# Patient Record
Sex: Female | Born: 1985
Health system: Southern US, Community
[De-identification: ages and names within clinical notes are randomized; demographics above are authoritative.]

## PROBLEM LIST (undated history)

## (undated) ENCOUNTER — Ambulatory Visit

## (undated) ENCOUNTER — Inpatient Hospital Stay (HOSPITAL_COMMUNITY): Payer: Self-pay

---

## 2010-01-30 ENCOUNTER — Emergency Department: Payer: Self-pay | Admitting: Emergency Medicine

## 2010-04-30 ENCOUNTER — Emergency Department: Payer: Self-pay | Admitting: Internal Medicine

## 2010-05-28 ENCOUNTER — Emergency Department: Payer: Self-pay | Admitting: Unknown Physician Specialty

## 2010-07-01 ENCOUNTER — Emergency Department: Payer: Self-pay | Admitting: Emergency Medicine

## 2010-11-25 ENCOUNTER — Emergency Department: Payer: Self-pay | Admitting: Emergency Medicine

## 2010-12-08 ENCOUNTER — Emergency Department: Payer: Self-pay | Admitting: Unknown Physician Specialty

## 2011-02-01 ENCOUNTER — Emergency Department: Payer: Self-pay | Admitting: *Deleted

## 2011-03-18 ENCOUNTER — Emergency Department: Payer: Self-pay | Admitting: Emergency Medicine

## 2011-04-06 ENCOUNTER — Emergency Department: Payer: Self-pay | Admitting: Emergency Medicine

## 2011-04-12 ENCOUNTER — Emergency Department: Payer: Self-pay | Admitting: Emergency Medicine

## 2011-08-29 ENCOUNTER — Emergency Department: Payer: Self-pay | Admitting: Emergency Medicine

## 2012-04-13 ENCOUNTER — Emergency Department: Payer: Self-pay | Admitting: Emergency Medicine

## 2013-11-22 ENCOUNTER — Other Ambulatory Visit (HOSPITAL_COMMUNITY)
Admission: RE | Admit: 2013-11-22 | Discharge: 2013-11-22 | Disposition: A | Discharge status: Home / Self Care | Payer: BC Managed Care – PPO | Source: Ambulatory Visit | Attending: Obstetrics & Gynecology | Admitting: Obstetrics & Gynecology

## 2013-11-22 ENCOUNTER — Other Ambulatory Visit: Payer: Self-pay | Admitting: Obstetrics & Gynecology

## 2013-11-22 DIAGNOSIS — Z113 Encounter for screening for infections with a predominantly sexual mode of transmission: Secondary | ICD-10-CM | POA: Insufficient documentation

## 2013-11-22 DIAGNOSIS — Z01419 Encounter for gynecological examination (general) (routine) without abnormal findings: Secondary | ICD-10-CM | POA: Insufficient documentation

## 2013-11-27 LAB — CYTOLOGY - PAP

## 2014-04-16 ENCOUNTER — Ambulatory Visit: Payer: Self-pay | Admitting: Gastroenterology

## 2014-09-09 LAB — SURGICAL PATHOLOGY

## 2017-12-28 ENCOUNTER — Encounter: Payer: Self-pay | Admitting: Emergency Medicine

## 2017-12-28 ENCOUNTER — Other Ambulatory Visit: Payer: Self-pay

## 2017-12-28 ENCOUNTER — Emergency Department
Admission: EM | Admit: 2017-12-28 | Discharge: 2017-12-28 | Disposition: A | Discharge status: Home / Self Care | Payer: No Typology Code available for payment source | Attending: Emergency Medicine | Admitting: Emergency Medicine

## 2017-12-28 ENCOUNTER — Emergency Department: Payer: No Typology Code available for payment source

## 2017-12-28 DIAGNOSIS — Y939 Activity, unspecified: Secondary | ICD-10-CM | POA: Diagnosis not present

## 2017-12-28 DIAGNOSIS — M25561 Pain in right knee: Secondary | ICD-10-CM | POA: Diagnosis not present

## 2017-12-28 DIAGNOSIS — M25571 Pain in right ankle and joints of right foot: Secondary | ICD-10-CM | POA: Diagnosis not present

## 2017-12-28 DIAGNOSIS — M791 Myalgia, unspecified site: Secondary | ICD-10-CM | POA: Diagnosis not present

## 2017-12-28 DIAGNOSIS — M545 Low back pain: Secondary | ICD-10-CM | POA: Diagnosis present

## 2017-12-28 DIAGNOSIS — Y998 Other external cause status: Secondary | ICD-10-CM | POA: Insufficient documentation

## 2017-12-28 DIAGNOSIS — Y92410 Unspecified street and highway as the place of occurrence of the external cause: Secondary | ICD-10-CM | POA: Diagnosis not present

## 2017-12-28 DIAGNOSIS — M7918 Myalgia, other site: Secondary | ICD-10-CM

## 2017-12-28 DIAGNOSIS — M25511 Pain in right shoulder: Secondary | ICD-10-CM | POA: Insufficient documentation
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
## 2019-02-07 ENCOUNTER — Encounter (HOSPITAL_BASED_OUTPATIENT_CLINIC_OR_DEPARTMENT_OTHER): Payer: Self-pay | Admitting: Emergency Medicine

## 2019-02-07 ENCOUNTER — Other Ambulatory Visit: Payer: Self-pay | Admitting: Emergency Medicine

## 2019-02-07 ENCOUNTER — Other Ambulatory Visit: Payer: Self-pay

## 2019-02-07 ENCOUNTER — Emergency Department (HOSPITAL_BASED_OUTPATIENT_CLINIC_OR_DEPARTMENT_OTHER)
Admission: EM | Admit: 2019-02-07 | Discharge: 2019-02-07 | Disposition: A | Discharge status: Home / Self Care | Payer: Medicaid Other | Attending: Emergency Medicine | Admitting: Emergency Medicine

## 2019-02-07 DIAGNOSIS — R350 Frequency of micturition: Secondary | ICD-10-CM | POA: Insufficient documentation

## 2019-02-07 DIAGNOSIS — N898 Other specified noninflammatory disorders of vagina: Secondary | ICD-10-CM | POA: Insufficient documentation

## 2019-02-07 DIAGNOSIS — R102 Pelvic and perineal pain: Secondary | ICD-10-CM | POA: Insufficient documentation

## 2019-02-07 LAB — WET PREP, GENITAL
Sperm: NONE SEEN
Trich, Wet Prep: NONE SEEN
Yeast Wet Prep HPF POC: NONE SEEN

## 2019-02-07 LAB — PREGNANCY, URINE: Preg Test, Ur: NEGATIVE

## 2019-02-07 LAB — URINALYSIS, ROUTINE W REFLEX MICROSCOPIC
Bilirubin Urine: NEGATIVE
Glucose, UA: NEGATIVE mg/dL
Ketones, ur: NEGATIVE mg/dL
Nitrite: NEGATIVE
Protein, ur: NEGATIVE mg/dL
Specific Gravity, Urine: 1.02 (ref 1.005–1.030)
pH: 6 (ref 5.0–8.0)

## 2019-02-07 LAB — URINALYSIS, MICROSCOPIC (REFLEX)

## 2019-02-07 MED ORDER — METRONIDAZOLE 500 MG PO TABS: 500.0000 mg | ORAL_TABLET | Freq: Two times a day (BID) | ORAL | 0 refills | Status: DC

## 2019-02-07 MED ORDER — CEFTRIAXONE SODIUM 250 MG IJ SOLR
250.0000 mg | Freq: Once | INTRAMUSCULAR | Status: AC
  Administered 2019-02-07: 250 mg via INTRAMUSCULAR
  Filled 2019-02-07: qty 250

## 2019-02-07 MED ORDER — METRONIDAZOLE 500 MG PO TABS
500.0000 mg | ORAL_TABLET | Freq: Once | ORAL | Status: AC
  Administered 2019-02-07: 500 mg via ORAL
  Filled 2019-02-07: qty 1

## 2019-02-07 MED ORDER — LIDOCAINE HCL (PF) 1 % IJ SOLN
INTRAMUSCULAR | Status: AC
  Administered 2019-02-07: 1.2 mL
  Filled 2019-02-07: qty 5

## 2019-02-07 MED ORDER — AZITHROMYCIN 250 MG PO TABS
1000.0000 mg | ORAL_TABLET | Freq: Once | ORAL | Status: AC
  Administered 2019-02-07: 1000 mg via ORAL
  Filled 2019-02-07: qty 4

## 2019-02-07 MED ORDER — ONDANSETRON 8 MG PO TBDP
8.0000 mg | ORAL_TABLET | Freq: Once | ORAL | Status: AC
  Administered 2019-02-07: 8 mg via ORAL
  Filled 2019-02-07: qty 1

## 2019-02-07 MED FILL — METRONIDAZOLE 500 MG TABS: 500 | 7 days supply | Qty: 14 | Fill #0

## 2019-02-07 NOTE — ED Triage Notes (Signed)
Urinary frequency, suprapubic tenderness, low back pain and slight vaginal discharge.

## 2019-02-07 NOTE — Discharge Instructions (Signed)
You were seen in the emergency department for urinary frequency and vaginal discharge.  You had testing for STDs that is still pending at time of discharge.  Your test here showed evidence of bacterial vaginosis.  We are prescribing you metronidazole to take twice a day for a week.  We also treated you for gonorrhea and chlamydia with medication in the department.  Please follow-up with your doctor and return if any worsening symptoms.  We will contact you if any of your other tests are positive.

## 2019-02-07 NOTE — ED Provider Notes (Signed)
[facility] EMERGENCY DEPARTMENT Provider Note   CSN: [id number] Arrival date & time: 02/07/19  0805     History   Chief Complaint Chief Complaint  Patient presents with   Urinary Frequency    HPI Erin Barker is a 33 y.o. female.  She is complaining of a few days of lower abdominal pain along with urinary frequency.  Pain is pelvic and radiates into her back.  It is associated with some vaginal discharge that does not have any particular odor or color.  No fevers or chills no nausea vomiting diarrhea constipation.  Sexually active, last menstrual period a few weeks ago.     The history is provided by the patient.  Urinary Frequency The current episode started yesterday. The problem occurs hourly. The problem has not changed since onset.Associated symptoms include abdominal pain. Pertinent negatives include no chest pain, no headaches and no shortness of breath. Nothing aggravates the symptoms. Nothing relieves the symptoms. She has tried nothing for the symptoms. The treatment provided no relief.    History reviewed. No pertinent past medical history.  There are no active problems to display for this patient.   History reviewed. No pertinent surgical history.   OB History   No obstetric history on file.      Home Medications    Prior to Admission medications   Not on File    Family History No family history on file.  Social History Social History   Tobacco Use   Smoking status: Never Smoker   Smokeless tobacco: Never Used  Substance Use Topics   Alcohol use: Never    Frequency: Never   Drug use: Never     Allergies   Patient has no known allergies.   Review of Systems Review of Systems  Constitutional: Negative for fever.  HENT: Negative for sore throat.   Eyes: Negative for visual disturbance.  Respiratory: Negative for shortness of breath.   Cardiovascular: Negative for chest pain.  Gastrointestinal: Positive for  abdominal pain.  Genitourinary: Positive for frequency. Negative for dysuria.  Musculoskeletal: Positive for back pain.  Skin: Negative for rash.  Neurological: Negative for headaches.     Physical Exam Updated Vital Signs BP 105/84 (BP Location: Right Arm)    Pulse 74    Temp 98.5 F (36.9 C) (Oral)    Resp 16    Ht 5\' 7"  (1.702 m)    Wt 102.3 kg    LMP 01/15/2019    SpO2 100%    BMI 35.33 kg/m   Physical Exam Vitals signs and nursing note reviewed.  Constitutional:      General: She is not in acute distress.    Appearance: She is well-developed.  HENT:     Head: Normocephalic and atraumatic.  Eyes:     Conjunctiva/sclera: Conjunctivae normal.  Neck:     Musculoskeletal: Neck supple.  Cardiovascular:     Rate and Rhythm: Normal rate and regular rhythm.     Heart sounds: No murmur.  Pulmonary:     Effort: Pulmonary effort is normal. No respiratory distress.     Breath sounds: Normal breath sounds.  Abdominal:     Palpations: Abdomen is soft.     Tenderness: There is no abdominal tenderness.  Musculoskeletal: Normal range of motion.  Skin:    General: Skin is warm and dry.     Capillary Refill: Capillary refill takes less than 2 seconds.  Neurological:     General: No focal deficit present.  Mental Status: She is alert.      ED Treatments / Results  Labs (all labs ordered are listed, but only abnormal results are displayed) Labs Reviewed  WET PREP, GENITAL - Abnormal; Notable for the following components:      Result Value   Clue Cells Wet Prep HPF POC PRESENT (*)    WBC, Wet Prep HPF POC MANY (*)    All other components within normal limits  URINALYSIS, ROUTINE W REFLEX MICROSCOPIC - Abnormal; Notable for the following components:   APPearance HAZY (*)    Hgb urine dipstick SMALL (*)    Leukocytes,Ua SMALL (*)    All other components within normal limits  URINALYSIS, MICROSCOPIC (REFLEX) - Abnormal; Notable for the following components:   Bacteria, UA  MANY (*)    All other components within normal limits  PREGNANCY, URINE  RPR  GC/CHLAMYDIA PROBE AMP ([place]) NOT AT [facility]    EKG None  Radiology No results found.  Procedures Procedures (including critical care time)  Medications Ordered in ED Medications  cefTRIAXone (ROCEPHIN) injection 250 mg (250 mg Intramuscular Given 02/07/19 1021)  azithromycin (ZITHROMAX) tablet 1,000 mg (1,000 mg Oral Given 02/07/19 1020)  metroNIDAZOLE (FLAGYL) tablet 500 mg (500 mg Oral Given 02/07/19 1021)  ondansetron (ZOFRAN-ODT) disintegrating tablet 8 mg (8 mg Oral Given 02/07/19 1018)  lidocaine (PF) (XYLOCAINE) 1 % injection (1.2 mLs  Given 02/07/19 1021)     Initial Impression / Assessment and Plan / ED Course  I have reviewed the triage vital signs and the nursing notes.  Pertinent labs & imaging results that were available during my care of the patient were reviewed by me and considered in my medical decision making (see chart for details).  Clinical Course as of Feb 07 1647  Wed Feb 07, 2019  0823 Here with urinary frequency small vaginal discharge, and lower abdominal pain radiating through to the back.  Differential includes urinary tract infection, PID, vaginitis, pregnancy.  Less likely ovarian torsion as she appears very comfortable, ruptured ovarian cyst.   [MB]  0907 Pelvic done with nurse Cristie Hem as chaperone.  Normal external genitalia.  Speculum exam with some small amount of thick white discharge.  Bimanual exam no cervical motion tenderness no adnexal mass or tenderness.   [MB]    Clinical Course User Index [MB] Hayden Rasmussen, MD       Final Clinical Impressions(s) / ED Diagnoses   Final diagnoses:  Vaginal discharge    ED Discharge Orders         Ordered    metroNIDAZOLE (FLAGYL) 500 MG tablet  2 times daily     02/07/19 1007           Hayden Rasmussen, MD 02/07/19 1649

## 2019-02-07 NOTE — ED Notes (Signed)
ED Provider at bedside. 

## 2019-02-08 LAB — CERVICOVAGINAL ANCILLARY ONLY
Chlamydia: NEGATIVE
Neisseria Gonorrhea: NEGATIVE

## 2019-02-08 LAB — RPR: RPR Ser Ql: NONREACTIVE

## 2019-08-28 ENCOUNTER — Emergency Department (HOSPITAL_BASED_OUTPATIENT_CLINIC_OR_DEPARTMENT_OTHER)
Admission: EM | Admit: 2019-08-28 | Discharge: 2019-08-28 | Disposition: A | Discharge status: Home / Self Care | Payer: Medicaid Other | Attending: Emergency Medicine | Admitting: Emergency Medicine

## 2019-08-28 ENCOUNTER — Other Ambulatory Visit: Payer: Self-pay

## 2019-08-28 ENCOUNTER — Encounter (HOSPITAL_BASED_OUTPATIENT_CLINIC_OR_DEPARTMENT_OTHER): Payer: Self-pay | Admitting: Emergency Medicine

## 2019-08-28 DIAGNOSIS — Z3A00 Weeks of gestation of pregnancy not specified: Secondary | ICD-10-CM | POA: Insufficient documentation

## 2019-08-28 DIAGNOSIS — O210 Mild hyperemesis gravidarum: Secondary | ICD-10-CM

## 2019-08-28 DIAGNOSIS — Z3491 Encounter for supervision of normal pregnancy, unspecified, first trimester: Secondary | ICD-10-CM

## 2019-08-28 DIAGNOSIS — O219 Vomiting of pregnancy, unspecified: Secondary | ICD-10-CM | POA: Insufficient documentation

## 2019-08-28 LAB — URINALYSIS, ROUTINE W REFLEX MICROSCOPIC
Bilirubin Urine: NEGATIVE
Glucose, UA: NEGATIVE mg/dL
Ketones, ur: NEGATIVE mg/dL
Leukocytes,Ua: NEGATIVE
Nitrite: NEGATIVE
Protein, ur: NEGATIVE mg/dL
Specific Gravity, Urine: >1.03 — ABNORMAL HIGH (ref 1.005–1.030)
pH: 5.5 (ref 5.0–8.0)

## 2019-08-28 LAB — URINALYSIS, MICROSCOPIC (REFLEX)

## 2019-08-28 LAB — CBC WITH DIFFERENTIAL/PLATELET
Abs Immature Granulocytes: 0.01 10*3/uL (ref 0.00–0.07)
Basophils Absolute: 0 10*3/uL (ref 0.0–0.1)
Basophils Relative: 0 %
Eosinophils Absolute: 0.2 10*3/uL (ref 0.0–0.5)
Eosinophils Relative: 3 %
HCT: 39.5 % (ref 36.0–46.0)
Hemoglobin: 12.7 g/dL (ref 12.0–15.0)
Immature Granulocytes: 0 %
Lymphocytes Relative: 36 %
Lymphs Abs: 2.4 10*3/uL (ref 0.7–4.0)
MCH: 31 pg (ref 26.0–34.0)
MCHC: 32.2 g/dL (ref 30.0–36.0)
MCV: 96.3 fL (ref 80.0–100.0)
Monocytes Absolute: 0.4 10*3/uL (ref 0.1–1.0)
Monocytes Relative: 7 %
Neutro Abs: 3.6 10*3/uL (ref 1.7–7.7)
Neutrophils Relative %: 54 %
Platelets: 367 10*3/uL (ref 150–400)
RBC: 4.1 MIL/uL (ref 3.87–5.11)
RDW: 13.6 % (ref 11.5–15.5)
WBC: 6.7 10*3/uL (ref 4.0–10.5)
nRBC: 0 % (ref 0.0–0.2)

## 2019-08-28 LAB — PREGNANCY, URINE: Preg Test, Ur: POSITIVE — AB

## 2019-08-28 LAB — HCG, QUANTITATIVE, PREGNANCY: hCG, Beta Chain, Quant, S: 1620 m[IU]/mL — ABNORMAL HIGH (ref <5)

## 2019-08-28 NOTE — ED Provider Notes (Signed)
[place] EMERGENCY DEPARTMENT Provider Note   CSN: [id number] Arrival date & time: 08/28/19  1020     History Chief Complaint  Patient presents with  . Emesis    Erin Barker is a 34 y.o. female.  The history is provided by the patient.  Emesis Severity:  Moderate Duration:  2 hours Timing:  Intermittent Number of daily episodes:  5 Quality:  Stomach contents Progression:  Improving Chronicity:  New Recent urination:  Normal Relieved by:  None tried Worsened by:  Nothing Ineffective treatments:  None tried Associated symptoms: no abdominal pain, no chills, no cough, no diarrhea, no fever, no headaches and no myalgias   Associated symptoms comment:  Patient reports that her menses was supposed to come on on 08/22/2019 but her period has not started yet.  She has had some breast tenderness.  She also reports taking a Plan B pill at the end of March.  She is sexually active with only one partner but does not use protection regularly.  She denies any urinary symptoms such as dysuria, frequency or urgency.  She has no vaginal discharge itching or burning.  No abdominal pain at this time. Risk factors: no alcohol use, no diabetes, no prior abdominal surgery, no sick contacts, no suspect food intake and no travel to endemic areas        No past medical history on file.  There are no problems to display for this patient.   No past surgical history on file.   OB History   No obstetric history on file.     No family history on file.  Social History   Tobacco Use  . Smoking status: Never Smoker  . Smokeless tobacco: Never Used  Substance Use Topics  . Alcohol use: Never  . Drug use: Never    Home Medications Prior to Admission medications   Medication Sig Start Date End Date Taking? Authorizing Provider  metroNIDAZOLE (FLAGYL) 500 MG tablet Take 1 tablet (500 mg total) by mouth 2 (two) times daily. 02/07/19   Hayden Rasmussen, MD     Allergies    No known allergies  Review of Systems   Review of Systems  Constitutional: Negative for chills and fever.  Respiratory: Negative for cough.   Gastrointestinal: Positive for vomiting. Negative for abdominal pain and diarrhea.  Musculoskeletal: Negative for myalgias.  Neurological: Negative for headaches.  All other systems reviewed and are negative.   Physical Exam Updated Vital Signs BP 115/78   Pulse 74   Temp 98.3 F (36.8 C) (Oral)   Resp 16   Ht 5\' 7"  (1.702 m)   Wt 101.6 kg   LMP 07/17/2019   SpO2 100%   BMI 35.08 kg/m   Physical Exam Vitals and nursing note reviewed.  Constitutional:      General: She is not in acute distress.    Appearance: Normal appearance. She is well-developed. She is obese.  HENT:     Head: Normocephalic and atraumatic.  Eyes:     Pupils: Pupils are equal, round, and reactive to light.  Cardiovascular:     Rate and Rhythm: Normal rate.  Pulmonary:     Effort: Pulmonary effort is normal.  Abdominal:     General: Bowel sounds are normal. There is no distension.     Palpations: Abdomen is soft.     Tenderness: There is no abdominal tenderness. There is no right CVA tenderness, left CVA tenderness, guarding or rebound.  Musculoskeletal:  General: No tenderness. Normal range of motion.     Comments: No edema  Skin:    General: Skin is warm and dry.     Findings: No rash.  Neurological:     Mental Status: She is alert and oriented to person, place, and time.     Cranial Nerves: No cranial nerve deficit.  Psychiatric:        Behavior: Behavior normal.     ED Results / Procedures / Treatments   Labs (all labs ordered are listed, but only abnormal results are displayed) Labs Reviewed  PREGNANCY, URINE - Abnormal; Notable for the following components:      Result Value   Preg Test, Ur POSITIVE (*)    All other components within normal limits  URINALYSIS, ROUTINE W REFLEX MICROSCOPIC - Abnormal; Notable for  the following components:   Specific Gravity, Urine >1.030 (*)    Hgb urine dipstick SMALL (*)    All other components within normal limits  URINALYSIS, MICROSCOPIC (REFLEX) - Abnormal; Notable for the following components:   Bacteria, UA MANY (*)    All other components within normal limits  HCG, QUANTITATIVE, PREGNANCY - Abnormal; Notable for the following components:   hCG, Beta Chain, Quant, S 1,620 (*)    All other components within normal limits  CBC WITH DIFFERENTIAL/PLATELET    EKG None  Radiology No results found.  Procedures Procedures (including critical care time)  Medications Ordered in ED Medications - No data to display  ED Course  I have reviewed the triage vital signs and the nursing notes.  Pertinent labs & imaging results that were available during my care of the patient were reviewed by me and considered in my medical decision making (see chart for details).    MDM Rules/Calculators/A&P                      Pt presenting today with new onset vomiting and missed menses.  LMP was the beginning of March.  Patient missed her period last week.  She did take a Plan B tablet at the end of March and does report not using protection regularly.  Patient denies any symptoms concerning for vaginitis or STI.  She has no urinary complaints and UA shows no sign of infection does have many bacteria but it is contaminated.  Urine pregnancy test today is positive.  hCG is pending.  Patient has no abdominal pain or concerns at this time for ectopic pregnancy.  Suspect nausea and vomiting is related to morning sickness from early pregnancy.  HCG is 1620 and consistent with about [redacted] weeks pregnant which is consistent with dates.  Due to no abd pain, vaginal bleeding or other issues at this time feel that pt is stable for d/c home and f/u with women's clinic for outpt u/s and serial hcg when she is farther along.  Low suspicion for ectopic at this time.  Pt given return  precautions.  MDM Number of Diagnoses or Management Options First trimester pregnancy: new, needed workup   Amount and/or Complexity of Data Reviewed Clinical lab tests: ordered and reviewed Decide to obtain previous medical records or to obtain history from someone other than the patient: no Obtain history from someone other than the patient: no Review and summarize past medical records: no  Risk of Complications, Morbidity, and/or Mortality Presenting problems: low Diagnostic procedures: minimal Management options: minimal  Patient Progress Patient progress: stable   Final Clinical Impression(s) / ED Diagnoses Final  diagnoses:  First trimester pregnancy  Morning sickness    Rx / DC Orders ED Discharge Orders    None       Gwyneth Sprout, MD 08/28/19 1353

## 2019-08-28 NOTE — ED Triage Notes (Signed)
Pt states she woke up at 5 am with vomiting x 5.  No fever, no diarrhea.   Some breast tenderness.

## 2019-08-28 NOTE — Discharge Instructions (Signed)
It is a good idea to keep crackers or some Gatorade handy in case you start to feel nauseated.  Currently the blood test shows you are approximately [redacted] weeks pregnant which is consistent with your last period.  If you start having vaginal bleeding, severe abdominal pain you need to return for reevaluation.

## 2019-10-30 ENCOUNTER — Emergency Department (HOSPITAL_BASED_OUTPATIENT_CLINIC_OR_DEPARTMENT_OTHER)
Admission: EM | Admit: 2019-10-30 | Discharge: 2019-10-30 | Disposition: A | Discharge status: Home / Self Care | Payer: Medicaid Other | Attending: Emergency Medicine | Admitting: Emergency Medicine

## 2019-10-30 ENCOUNTER — Other Ambulatory Visit: Payer: Self-pay

## 2019-10-30 ENCOUNTER — Encounter (HOSPITAL_BASED_OUTPATIENT_CLINIC_OR_DEPARTMENT_OTHER): Payer: Self-pay

## 2019-10-30 DIAGNOSIS — Z113 Encounter for screening for infections with a predominantly sexual mode of transmission: Secondary | ICD-10-CM

## 2019-10-30 DIAGNOSIS — Z3201 Encounter for pregnancy test, result positive: Secondary | ICD-10-CM

## 2019-10-30 LAB — URINALYSIS, MICROSCOPIC (REFLEX)

## 2019-10-30 LAB — URINALYSIS, ROUTINE W REFLEX MICROSCOPIC
Bilirubin Urine: NEGATIVE
Glucose, UA: NEGATIVE mg/dL
Ketones, ur: NEGATIVE mg/dL
Leukocytes,Ua: NEGATIVE
Nitrite: NEGATIVE
Protein, ur: NEGATIVE mg/dL
Specific Gravity, Urine: 1.02 (ref 1.005–1.030)
pH: 7 (ref 5.0–8.0)

## 2019-10-30 LAB — WET PREP, GENITAL
Clue Cells Wet Prep HPF POC: NONE SEEN
Sperm: NONE SEEN
Trich, Wet Prep: NONE SEEN
Yeast Wet Prep HPF POC: NONE SEEN

## 2019-10-30 LAB — HCG, QUANTITATIVE, PREGNANCY: hCG, Beta Chain, Quant, S: 78 m[IU]/mL — ABNORMAL HIGH (ref <5)

## 2019-10-30 NOTE — ED Provider Notes (Signed)
[facility] EMERGENCY DEPARTMENT Provider Note   CSN: [id number] Arrival date & time: 10/30/19  1131     History Chief Complaint  Patient presents with  . SEXUALLY TRANSMITTED DISEASE    Erin Barker is a 34 y.o. [phone]  female who presents for STD check.  Patient states that she had a "slip up" recently and went to [facility] to be tested for STDs.  She reports recent abortion in April.  She had a negative pregnancy test after it was completed.  Her last menstrual period was May 8.  She had a pregnancy test done yesterday at [facility] and it came back positive.   They told her they cannot do STD testing on her because of that.  They recommended she go to OB/GYN or the health department or the ER for further testing.  She went home and took 2 pregnancy tests and both were negative. She denies any symptoms.    HPI     History reviewed. No pertinent past medical history.  There are no problems to display for this patient.   History reviewed. No pertinent surgical history.   OB History   No obstetric history on file.     No family history on file.  Social History   Tobacco Use  . Smoking status: Never Smoker  . Smokeless tobacco: Never Used  Vaping Use  . Vaping Use: Never used  Substance Use Topics  . Alcohol use: Yes    Comment: social  . Drug use: Never    Home Medications Prior to Admission medications   Medication Sig Start Date End Date Taking? Authorizing Provider  metroNIDAZOLE (FLAGYL) 500 MG tablet Take 1 tablet (500 mg total) by mouth 2 (two) times daily. 02/07/19   Hayden Rasmussen, MD    Allergies    No known allergies  Review of Systems   Review of Systems  Constitutional: Negative for fever.  Gastrointestinal: Negative for abdominal pain, nausea and vomiting.  Genitourinary: Negative for difficulty urinating, dysuria, menstrual problem, vaginal bleeding and vaginal discharge.  All other systems  reviewed and are negative.   Physical Exam Updated Vital Signs BP 128/80 (BP Location: Right Arm)   Pulse 88   Temp 98.6 F (37 C) (Oral)   Resp 18   Ht 5\' 7"  (1.702 m)   Wt 104.5 kg   LMP 10/06/2019   SpO2 100%   BMI 36.07 kg/m   Physical Exam Vitals and nursing note reviewed.  Constitutional:      General: She is not in acute distress.    Appearance: She is well-developed. She is obese. She is not ill-appearing.  HENT:     Head: Normocephalic and atraumatic.  Eyes:     General: No scleral icterus.       Right eye: No discharge.        Left eye: No discharge.     Conjunctiva/sclera: Conjunctivae normal.     Pupils: Pupils are equal, round, and reactive to light.  Cardiovascular:     Rate and Rhythm: Normal rate and regular rhythm.  Pulmonary:     Effort: Pulmonary effort is normal. No respiratory distress.     Breath sounds: Normal breath sounds.  Abdominal:     General: There is no distension.  Genitourinary:    Comments: Pelvic: No inguinal lymphadenopathy or inguinal hernia noted. Normal external genitalia. No pain with speculum insertion. Closed cervical os with normal appearance - no rash or lesions. Thick brown  appearing discharge in vagina. On bimanual examination no adnexal tenderness or cervical motion tenderness. Chaperone (Sophie, RN) present during exam.   Musculoskeletal:     Cervical back: Normal range of motion.  Skin:    General: Skin is warm and dry.  Neurological:     Mental Status: She is alert and oriented to person, place, and time.  Psychiatric:        Behavior: Behavior normal.     ED Results / Procedures / Treatments   Labs (all labs ordered are listed, but only abnormal results are displayed) Labs Reviewed  WET PREP, GENITAL - Abnormal; Notable for the following components:      Result Value   WBC, Wet Prep HPF POC MANY (*)    All other components within normal limits  URINALYSIS, ROUTINE W REFLEX MICROSCOPIC - Abnormal; Notable for  the following components:   Hgb urine dipstick TRACE (*)    All other components within normal limits  HCG, QUANTITATIVE, PREGNANCY - Abnormal; Notable for the following components:   hCG, Beta Chain, Quant, S 78 (*)    All other components within normal limits  URINALYSIS, MICROSCOPIC (REFLEX) - Abnormal; Notable for the following components:   Bacteria, UA RARE (*)    All other components within normal limits  RPR  GC/CHLAMYDIA PROBE AMP ([place]) NOT AT [facility]    EKG None  Radiology No results found.  Procedures Procedures (including critical care time)  Medications Ordered in ED Medications - No data to display  ED Course  I have reviewed the triage vital signs and the nursing notes.  Pertinent labs & imaging results that were available during my care of the patient were reviewed by me and considered in my medical decision making (see chart for details).  34 year old female presents for STD screen and confirmation of pregnancy.  Her vital signs are normal.  Abdomen is soft and nontender.  Her pelvic exam shows a brownish discharge without any tenderness.  Her hCG is 62 here consistent with a 2-week pregnancy.  Wet prep is negative.  UA does not show signs of infection.  Gonorrhea, chlamydia, RPR sent off.  She declined HIV testing today.  Patient informed of results.  She was encouraged to follow-up with [facility]N or [facility].  MDM Rules/Calculators/A&P                           Final Clinical Impression(s) / ED Diagnoses Final diagnoses:  Screen for STD (sexually transmitted disease)  Positive pregnancy test    Rx / DC Orders ED Discharge Orders    None       Bethel Born, PA-C 10/30/19 1427    Cathren Laine, MD 10/30/19 1436

## 2019-10-30 NOTE — ED Triage Notes (Signed)
Pt arrives with reports of recent abortion 3 months ago states that [facility] states that they would not check or test her there because her pregnancy test stated she was pregnant. Last period was May 8th, states she had  2 week cycle at that time. Denies any symptoms of STI.

## 2019-10-31 LAB — GC/CHLAMYDIA PROBE AMP (~~LOC~~) NOT AT ARMC
Chlamydia: NEGATIVE
Comment: NEGATIVE
Comment: NORMAL
Neisseria Gonorrhea: NEGATIVE

## 2019-10-31 LAB — RPR: RPR Ser Ql: NONREACTIVE

## 2019-11-03 ENCOUNTER — Encounter (HOSPITAL_BASED_OUTPATIENT_CLINIC_OR_DEPARTMENT_OTHER): Payer: Self-pay | Admitting: Emergency Medicine

## 2019-11-03 ENCOUNTER — Other Ambulatory Visit: Payer: Self-pay

## 2019-11-03 ENCOUNTER — Emergency Department (HOSPITAL_BASED_OUTPATIENT_CLINIC_OR_DEPARTMENT_OTHER)
Admission: EM | Admit: 2019-11-03 | Discharge: 2019-11-03 | Disposition: A | Discharge status: Home / Self Care | Payer: Medicaid Other | Attending: Emergency Medicine | Admitting: Emergency Medicine

## 2019-11-03 DIAGNOSIS — N939 Abnormal uterine and vaginal bleeding, unspecified: Secondary | ICD-10-CM

## 2019-11-03 LAB — HCG, QUANTITATIVE, PREGNANCY: hCG, Beta Chain, Quant, S: 43 m[IU]/mL — ABNORMAL HIGH (ref <5)

## 2019-11-03 NOTE — ED Notes (Signed)
Pt discharged to home. Discharge instructions have been discussed with patient and/or family members. Pt verbally acknowledges understanding d/c instructions, and endorses comprehension to checkout at registration before leaving.  °

## 2019-11-03 NOTE — Discharge Instructions (Signed)
Call the OB/GYN office to set up an appointment.

## 2019-11-03 NOTE — ED Provider Notes (Signed)
[facility] EMERGENCY DEPARTMENT Provider Note   CSN: [id number] Arrival date & time: 11/03/19  4665     History Chief Complaint  Patient presents with  . Vaginal Bleeding    Erin Barker is a 34 y.o. female.  HPI      Erin Barker is a 34 y.o. female, with a history of elective abortion, presenting to the ED with vaginal bleeding for the last 2 days. She describes this bleeding as intermittent spotting, sometimes brown sometimes bright red.  She has not had any consistent bleeding or flow.  She notices the blood the most when she wipes after urinating, however, there is no blood in the toilet.  She had mild, transient suprapubic cramping yesterday, but none today. On April 27 patient underwent elective abortion.  She did not have any symptoms afterward.  She had a negative pregnancy test upon follow-up. May 8, patient started what she thought was a regular menstrual cycle. She has not yet had a menstrual cycle in June. June 15 patient was seen in the ED and had a positive quantitative hCG.  Denies fever/chills, continued abdominal pain, N/V/D, dyspareunia, flank/back pain, urinary symptoms, or any other complaints.   History reviewed. No pertinent past medical history.  There are no problems to display for this patient.   History reviewed. No pertinent surgical history.   OB History    Gravida  1   Para      Term      Preterm      AB      Living        SAB      TAB      Ectopic      Multiple      Live Births              No family history on file.  Social History   Tobacco Use  . Smoking status: Never Smoker  . Smokeless tobacco: Never Used  Vaping Use  . Vaping Use: Never used  Substance Use Topics  . Alcohol use: Yes    Comment: social  . Drug use: Never    Home Medications Prior to Admission medications   Medication Sig Start Date End Date Taking? Authorizing Provider  metroNIDAZOLE (FLAGYL) 500 MG  tablet Take 1 tablet (500 mg total) by mouth 2 (two) times daily. 02/07/19   Hayden Rasmussen, MD    Allergies    No known allergies  Review of Systems   Review of Systems  Constitutional: Negative for chills, diaphoresis and fever.  Respiratory: Negative for shortness of breath.   Gastrointestinal: Negative for abdominal pain, diarrhea, nausea and vomiting.  Genitourinary: Positive for vaginal bleeding. Negative for dysuria, flank pain, frequency, hematuria and pelvic pain.  Musculoskeletal: Negative for back pain.  Neurological: Negative for dizziness, syncope and weakness.  All other systems reviewed and are negative.   Physical Exam Updated Vital Signs BP 114/76   Pulse 74   Temp 98.9 F (37.2 C) (Oral)   Resp 20   Ht 5\' 7"  (1.702 m)   Wt 104.3 kg   LMP 09/22/2019   SpO2 100%   BMI 36.02 kg/m   Physical Exam Vitals and nursing note reviewed.  Constitutional:      General: She is not in acute distress.    Appearance: She is well-developed. She is not diaphoretic.  HENT:     Head: Normocephalic and atraumatic.     Mouth/Throat:  Mouth: Mucous membranes are moist.     Pharynx: Oropharynx is clear.  Eyes:     Conjunctiva/sclera: Conjunctivae normal.  Cardiovascular:     Rate and Rhythm: Normal rate and regular rhythm.     Pulses: Normal pulses.          Radial pulses are 2+ on the right side and 2+ on the left side.  Pulmonary:     Effort: Pulmonary effort is normal. No respiratory distress.  Abdominal:     Palpations: Abdomen is soft.     Tenderness: There is no abdominal tenderness. There is no guarding.  Musculoskeletal:     Cervical back: Neck supple.  Lymphadenopathy:     Cervical: No cervical adenopathy.  Skin:    General: Skin is warm and dry.  Neurological:     Mental Status: She is alert.  Psychiatric:        Mood and Affect: Mood and affect normal.        Speech: Speech normal.        Behavior: Behavior normal.     ED Results /  Procedures / Treatments   Labs (all labs ordered are listed, but only abnormal results are displayed) Labs Reviewed  HCG, QUANTITATIVE, PREGNANCY - Abnormal; Notable for the following components:      Result Value   hCG, Beta Chain, Quant, S 43 (*)    All other components within normal limits    EKG None  Radiology No results found.  Procedures Procedures (including critical care time)  Medications Ordered in ED Medications - No data to display  ED Course  I have reviewed the triage vital signs and the nursing notes.  Pertinent labs & imaging results that were available during my care of the patient were reviewed by me and considered in my medical decision making (see chart for details).  Clinical Course as of Nov 02 1229  Sat Nov 03, 2019  1225 78 four days ago.  HCG, Beta Chain, Quant, S(!): 43 [SJ]  1226 Spoke with Dr. Jolayne Panther, OBGYN. Agrees with next step of office follow up. No further work up necessary here in the ED.   [SJ]    Clinical Course User Index [SJ] Rael Tilly, Hillard Danker, PA-C   MDM Rules/Calculators/A&P                          Patient presents with concerns for vaginal bleeding.  It is described as a very small amount consistent with spotting.  No current abdominal pain.  No tenderness on exam. Patient is nontoxic appearing, afebrile, not tachycardic, not tachypneic, not hypotensive, excellent SPO2 on room air, and is in no apparent distress.   I have reviewed the patient's chart to obtain more information.   OB/GYN follow-up.  Resources given. Return precautions discussed.  Patient voices understanding of these instructions, accepts the plan, and is comfortable with discharge.   Final Clinical Impression(s) / ED Diagnoses Final diagnoses:  Vaginal spotting    Rx / DC Orders ED Discharge Orders    None       Concepcion Living 11/03/19 1231    Alvira Monday, MD 11/03/19 2234

## 2019-11-03 NOTE — ED Triage Notes (Signed)
Vaginal bleeding x 2 days. Reports early pregnancy diagnosed earlier this week.

## 2019-11-03 NOTE — ED Notes (Signed)
ED Provider at bedside. 

## 2019-11-06 ENCOUNTER — Other Ambulatory Visit: Payer: Self-pay

## 2019-11-06 ENCOUNTER — Ambulatory Visit (INDEPENDENT_AMBULATORY_CARE_PROVIDER_SITE_OTHER): Payer: Self-pay

## 2019-11-06 DIAGNOSIS — O074 Failed attempted termination of pregnancy without complication: Secondary | ICD-10-CM

## 2019-11-06 DIAGNOSIS — Z332 Encounter for elective termination of pregnancy: Secondary | ICD-10-CM

## 2019-11-06 LAB — BETA HCG QUANT (REF LAB): hCG Quant: 24 m[IU]/mL

## 2019-11-06 NOTE — Progress Notes (Signed)
Pt scheduled for STAT Beta Lab.  Reviewed chart with Dr. Alysia Penna who confirms that patient does not need to be a stat beta due to knowing that this is s/p TAB and not an ectopic pregnancy.  Pt denies any pain but is having some scant vaginal bleeding.  Pt reports that she had a TAB on 09/06/19 by taking a pill.  Pt states that she bleed for 7 days after taking the abortion pill, stopped and then started bleeding again on May 8th which started out as brown bleeding and it lasted for two weeks.  Pt states that [facility] where she was informed that she had a positive pregnancy test.  Pt reports having intercourse on May 24th.  I explained to the pt that today we are drawing her pregnancy levels to show that it should going down. I advised pt that if she has increased pain or starts bleeding like a period to please call the office.  I informed pt that we will call with results.    Per Dr. Alysia Penna, pt beta levels decreasing and to f/u in one week for beta level to trend down to zero.  Called pt and informed pt provider recommendation.  Pt stated that she will be able to come in on June 29th @ 1330 for non stat beta level.  Front office notfied to place pt on schedule.    Addison Naegeli, RN  11/06/19

## 2019-11-06 NOTE — Progress Notes (Signed)
Agree with A & P. 

## 2019-11-13 ENCOUNTER — Other Ambulatory Visit: Payer: Medicaid Other

## 2019-12-03 ENCOUNTER — Ambulatory Visit: Payer: Self-pay

## 2019-12-03 ENCOUNTER — Other Ambulatory Visit: Payer: Self-pay

## 2019-12-03 ENCOUNTER — Ambulatory Visit: Payer: Self-pay | Admitting: Family Medicine

## 2019-12-03 DIAGNOSIS — Z113 Encounter for screening for infections with a predominantly sexual mode of transmission: Secondary | ICD-10-CM

## 2019-12-03 LAB — WET PREP FOR TRICH, YEAST, CLUE
Trichomonas Exam: NEGATIVE
Yeast Exam: NEGATIVE

## 2019-12-03 MED ORDER — METRONIDAZOLE 500 MG PO TABS: 500.0000 mg | ORAL_TABLET | Freq: Two times a day (BID) | ORAL | 0 refills | Status: AC

## 2019-12-03 NOTE — Progress Notes (Signed)
Wet mount results reviewed, pt treated for BV per standing order. Sharlyne Pacas, RN

## 2019-12-03 NOTE — Progress Notes (Signed)
[facility] Department STI clinic/screening visit  Subjective:  Erin Barker is a 34 y.o. female being seen today for an STI screening visit. The patient reports they do have symptoms.  Patient reports that they do not desire a pregnancy in the next year.   They reported they are not interested in discussing contraception today.  Patient's last menstrual period was 09/22/2019 (approximate).   Patient has the following medical conditions:  There are no problems to display for this patient.   Chief Complaint  Patient presents with  . Exposure to STD    HPI  Patient reports vaginal discharge for about 1 week. She reports her partner admitted to "stepping out" prior to their last sexual encounter.   Last HIV test per patient/review of record was last month. Does not desire today.   Patient reports last pap was within past year.   See flowsheet for further details and programmatic requirements.    The following portions of the patient's history were reviewed and updated as appropriate: allergies, current medications, past medical history, past social history, past surgical history and problem list.  Objective:  There were no vitals filed for this visit.  Physical Exam Vitals and nursing note reviewed.  Constitutional:      Appearance: Normal appearance.  HENT:     Head: Normocephalic and atraumatic.     Mouth/Throat:     Mouth: Mucous membranes are moist.     Pharynx: Oropharynx is clear. No oropharyngeal exudate or posterior oropharyngeal erythema.  Pulmonary:     Effort: Pulmonary effort is normal.  Abdominal:     General: Abdomen is flat.     Palpations: There is no mass.     Tenderness: There is no abdominal tenderness. There is no rebound.  Genitourinary:    General: Normal vulva.     Exam position: Lithotomy position.     Pubic Area: No rash or pubic lice.      Labia:        Right: No rash or lesion.        Left: No rash or lesion.       Vagina: Normal. No vaginal discharge, erythema, bleeding or lesions.     Cervix: No cervical motion tenderness, discharge, friability, lesion or erythema.     Uterus: Normal.      Adnexa: Right adnexa normal and left adnexa normal.     Rectum: Normal.  Lymphadenopathy:     Head:     Right side of head: No preauricular or posterior auricular adenopathy.     Left side of head: No preauricular or posterior auricular adenopathy.     Cervical: No cervical adenopathy.     Upper Body:     Right upper body: No supraclavicular or axillary adenopathy.     Left upper body: No supraclavicular or axillary adenopathy.     Lower Body: No right inguinal adenopathy. No left inguinal adenopathy.  Skin:    General: Skin is warm and dry.     Findings: No rash.  Neurological:     Mental Status: She is alert and oriented to person, place, and time.      Assessment and Plan:  Erin Barker is a 34 y.o. female presenting to the [facility] Department for STI screening  1. Screening examination for venereal disease Patient accepted all screenings including vaginal CT/GC and declined bloodwork for HIV/RPR.   Treat wet prep per standing order= ph > 4.5 Discussed time line for [facility]  Lab results and that patient will be called with positive results and encouraged patient to call if she had not heard in 2 weeks.  Counseled to return or seek care for continued or worsening symptoms Recommended condom use with all sex  Patient is currently using condoms, sometimes to prevent pregnancy. Recommended home UPTs regularly when she skips her cycle. Also patient declined BCM today or discussion and "if it happens it happens but I am not planning on a pregnancy"  - WET PREP FOR TRICH, YEAST, CLUE - Chlamydia/Gonorrhea [facility] - Patient treated per standing for BV based on wet mount results.    Return if symptoms worsen or fail to improve.  No future appointments.  Federico Flake,  MD

## 2019-12-11 ENCOUNTER — Telehealth: Payer: Self-pay | Admitting: Family Medicine

## 2019-12-11 NOTE — Telephone Encounter (Signed)
Per chart review, Wendi Snipes RN - STI Coordinator has already called client with results. Jossie Ng, RN

## 2019-12-11 NOTE — Telephone Encounter (Signed)
Patient wants result for [facility]. Wants to talk to nurse in regards to testing results.

## 2019-12-11 NOTE — Telephone Encounter (Signed)
Call to patient to discuss positive TR.  Patient verified by password.  Patient informed of  + GC.  Patient reports out of town and informed to have clinic of choice to contact to have TR sent so that she an be treated. Or patient can get results through my chart.  My chart link sent.  Patient verbalizes understanding at this time and has no further questions. Wendi Snipes, RN

## 2019-12-21 ENCOUNTER — Encounter: Payer: Self-pay | Admitting: Family Medicine

## 2019-12-24 ENCOUNTER — Telehealth: Payer: Self-pay | Admitting: Family Medicine

## 2019-12-24 DIAGNOSIS — Z7689 Persons encountering health services in other specified circumstances: Secondary | ICD-10-CM

## 2019-12-24 NOTE — Telephone Encounter (Signed)
Please call me I need meds for yeast

## 2019-12-25 MED ORDER — FLUCONAZOLE 150 MG PO TABS
150.0000 mg | ORAL_TABLET | Freq: Once | ORAL | 0 refills | Status: AC
Start: 2019-12-25 — End: 2019-12-25

## 2019-12-25 NOTE — Telephone Encounter (Signed)
Patient return provider calls. Patient states pharmacy is [facility]. If any further questions please call patient back.

## 2019-12-25 NOTE — Telephone Encounter (Signed)
Provider unable to determine pt's preferred pharmacy as [facility] to [facility] [address] to send [facility]-prescription to. Call to pt at phone # provided. Call went to voicemail and message left for pt to return call to [facility] and call back phone # provided, so pt can verify which pharmacy she would like prescription sent to.

## 2019-12-25 NOTE — Telephone Encounter (Signed)
Returned call to pt at phone # provided. Pt reports that she feels like she is having a yeast infection as she is having vaginal itching and dryness since Sunday. Pt was here 12/03/2019 where she was treated for BV, and pt was notified of positive GC result by Wendi Snipes, STD coordinator 12/11/2019. Asked pt if she was treated for positive result and pt reports that she was out of town but did go and get treated. Pt reports that she does not remember the name of the clinic but that she knows she got a shot and got some Doxycycline pills that she finished. Pt reports she has not been sexually active since before she's been treated and does not have any other symptoms other than having the vaginal itching and dryness. Pt reports that she has not tried any OTC yeast creams as she does not like to use them and is requesting Diflucan. Counseled pt that I would speak with a provider to see if they would like pt to RTC or what they would like pt to do. Pt states understanding. Consulted with Larene Pickett, FNP regarding above information and conversation with pt. Per Larene Pickett, FNP she will call a prescription in for pt to have Diflucan. Returned call to pt and let pt know that Diflucan will be called in for pt. Pt [facility] [address] [facility] [place] in [place], [place]. Counseled pt that I will let provider know but to give [place] a call if she has any other concerns or issues getting medication and pt states understanding. Pt with no further questions at this time.

## 2020-01-07 ENCOUNTER — Encounter: Payer: Self-pay | Admitting: Family Medicine

## 2023-01-17 ENCOUNTER — Encounter (HOSPITAL_BASED_OUTPATIENT_CLINIC_OR_DEPARTMENT_OTHER): Payer: Self-pay

## 2023-01-17 ENCOUNTER — Emergency Department (HOSPITAL_BASED_OUTPATIENT_CLINIC_OR_DEPARTMENT_OTHER)
Admission: EM | Admit: 2023-01-17 | Discharge: 2023-01-17 | Disposition: A | Discharge status: Home / Self Care | Payer: 59 | Attending: Emergency Medicine | Admitting: Emergency Medicine

## 2023-01-17 ENCOUNTER — Other Ambulatory Visit: Payer: Self-pay

## 2023-01-17 DIAGNOSIS — F53 Postpartum depression: Secondary | ICD-10-CM | POA: Diagnosis not present

## 2023-01-17 DIAGNOSIS — F419 Anxiety disorder, unspecified: Secondary | ICD-10-CM | POA: Diagnosis present

## 2023-01-17 MED ORDER — ALPRAZOLAM 0.25 MG PO TABS: 0.2500 mg | ORAL_TABLET | Freq: Every evening | ORAL | 0 refills | Status: DC | PRN

## 2023-01-17 NOTE — ED Provider Notes (Signed)
[place] [facility] Provider Note   CSN: [id number] Arrival date & time: 01/17/23  1631     History  Chief Complaint  Patient presents with   Anxiety    Erin Barker is a 37 y.o. female.  The history is provided by the patient and medical records. No language interpreter was used.  Anxiety     37 year old female who is 7 months postpartum presenting with complaint of feeling anxious.  Patient is a G5, P4.  She mention when she first delivered her baby she did have some postpartum depression and her OB/GYN did prescribe a short course of Xanax which she takes for a few days but none since.  Within the past month she endorsed having increasing stress which includes having to care for her mother with her illness, resuming back to work and having to care for her child including breast-feeding the child and she felt overwhelmed.  She reported having intrusive thoughts, feeling uneasy and anxious more so than usual.  She does not endorse any SI HI.  States that eating and sleeping habits is about the same.  She does not self medicate with drugs or alcohol.  She is not on any antidepression medication.  She did not have postpartum depression in the past.  She feels overwhelmed.  Home Medications Prior to Admission medications   Not on File      Allergies    No known allergies    Review of Systems   Review of Systems  All other systems reviewed and are negative.   Physical Exam Updated Vital Signs BP (!) 128/91   Pulse 63   Temp 98.2 F (36.8 C) (Oral)   Resp 18   Ht 5' 6.5" (1.689 m)   Wt 104.3 kg   SpO2 99%   Breastfeeding Yes   BMI 36.57 kg/m  Physical Exam Vitals and nursing note reviewed.  Constitutional:      General: She is not in acute distress.    Appearance: She is well-developed. She is obese.  HENT:     Head: Atraumatic.  Eyes:     Conjunctiva/sclera: Conjunctivae normal.  Pulmonary:     Effort: Pulmonary  effort is normal.  Musculoskeletal:     Cervical back: Neck supple.  Skin:    Findings: No rash.  Neurological:     Mental Status: She is alert.  Psychiatric:        Mood and Affect: Mood normal.        Speech: Speech normal.        Behavior: Behavior is cooperative.        Thought Content: Thought content normal. Thought content does not include homicidal or suicidal ideation.     ED Results / Procedures / Treatments   Labs (all labs ordered are listed, but only abnormal results are displayed) Labs Reviewed - No data to display  EKG None  Radiology No results found.  Procedures Procedures    Medications Ordered in ED Medications - No data to display  ED Course/ Medical Decision Making/ A&P                                 Medical Decision Making  BP 130/84 (BP Location: Right Arm)   Pulse 68   Temp 98.2 F (36.8 C) (Oral)   Resp 17   Ht 5' 6.5" (1.689 m)   Wt 104.3 kg  SpO2 97%   Breastfeeding Yes   BMI 36.57 kg/m   12:63 PM 37 year old female who is 7 months postpartum presenting with complaint of feeling anxious.  Patient is a G5, P4.  She mention when she first delivered her baby she did have some postpartum depression and her OB/GYN did prescribe a short course of Xanax which she takes for a few days but none since.  Within the past month she endorsed having increasing stress which includes having to care for her mother with her illness, resuming back to work and having to care for her child including breast-feeding the child and she felt overwhelmed.  She reported having intrusive thoughts, feeling uneasy and anxious more so than usual.  She does not endorse any SI HI.  States that eating and sleeping habits is about the same.  She does not self medicate with drugs or alcohol.  She is not on any antidepression medication.  She did not have postpartum depression in the past.  She feels overwhelmed.  On exam, obese female resting comfortably in bed appears to  be in no acute discomfort.  She has normal phonation.  She is making good eye contact.  She is well-appearing, dressing appropriately and answer questions appropriately.  Vital signs overall reassuring.  As far as her postpartum depression, I felt patient would likely benefit from outpatient management which may include psychiatric care, she may benefit from an SSRI, cognitive behavioral therapy, and other coping mechanism.  However I felt after further discussion, a short course of benzodiazepine would be reasonable.  Will prescribe patient 5 small dose of Xanax to use only as needed.  And I gave patient return precaution.  I also provide work note as per request.  Hospital admission considered but I felt patient stable to be discharged home.        Final Clinical Impression(s) / ED Diagnoses Final diagnoses:  Post partum depression    Rx / DC Orders ED Discharge Orders          Ordered    ALPRAZolam (XANAX) 0.25 MG tablet  At bedtime PRN        01/17/23 2042              Fayrene Helper, PA-C 01/17/23 2044    Laurence Spates, MD 01/18/23 1233

## 2023-01-17 NOTE — ED Triage Notes (Addendum)
Pt 7 months postpartum complains of "feeling uneasy", and anxiety. Pt previously treated for PPA with xanax, but has not been taking recently. Pt reports worsening intrusive thoughts recently. No HI/SI

## 2023-01-17 NOTE — Discharge Instructions (Signed)
You will likely benefit from outpatient evaluation by a psychiatrist for further managements of your postpartum depression.  You may benefit from medication such as SSRI, along with cognitive behavioral therapy and coping mechanism.  You may take Xanax only as needed for severe anxiety and depression but be aware it can cause drowsiness.

## 2023-06-08 ENCOUNTER — Other Ambulatory Visit: Payer: Self-pay

## 2023-06-08 DIAGNOSIS — R102 Pelvic and perineal pain: Secondary | ICD-10-CM

## 2023-06-08 DIAGNOSIS — Z113 Encounter for screening for infections with a predominantly sexual mode of transmission: Secondary | ICD-10-CM | POA: Diagnosis not present

## 2023-06-08 DIAGNOSIS — R300 Dysuria: Secondary | ICD-10-CM | POA: Diagnosis not present

## 2023-06-08 DIAGNOSIS — M6208 Separation of muscle (nontraumatic), other site: Secondary | ICD-10-CM | POA: Diagnosis not present

## 2023-06-09 ENCOUNTER — Other Ambulatory Visit: Payer: 59

## 2023-06-09 ENCOUNTER — Ambulatory Visit
Admission: RE | Admit: 2023-06-09 | Discharge: 2023-06-09 | Disposition: A | Discharge status: Home / Self Care | Payer: Medicaid Other | Source: Ambulatory Visit

## 2023-06-09 DIAGNOSIS — R102 Pelvic and perineal pain: Secondary | ICD-10-CM

## 2023-09-19 DIAGNOSIS — M25562 Pain in left knee: Secondary | ICD-10-CM | POA: Diagnosis not present

## 2023-09-19 DIAGNOSIS — E66812 Obesity, class 2: Secondary | ICD-10-CM | POA: Diagnosis not present

## 2023-09-19 DIAGNOSIS — E559 Vitamin D deficiency, unspecified: Secondary | ICD-10-CM | POA: Diagnosis not present

## 2023-09-19 DIAGNOSIS — Z7689 Persons encountering health services in other specified circumstances: Secondary | ICD-10-CM | POA: Diagnosis not present

## 2023-09-19 DIAGNOSIS — D649 Anemia, unspecified: Secondary | ICD-10-CM | POA: Diagnosis not present

## 2023-09-19 DIAGNOSIS — Z131 Encounter for screening for diabetes mellitus: Secondary | ICD-10-CM | POA: Diagnosis not present

## 2023-10-21 ENCOUNTER — Other Ambulatory Visit: Payer: Self-pay

## 2023-10-21 ENCOUNTER — Emergency Department (HOSPITAL_BASED_OUTPATIENT_CLINIC_OR_DEPARTMENT_OTHER)
Admission: EM | Admit: 2023-10-21 | Discharge: 2023-10-21 | Disposition: A | Discharge status: Home / Self Care | Attending: Emergency Medicine | Admitting: Emergency Medicine

## 2023-10-21 ENCOUNTER — Encounter (HOSPITAL_BASED_OUTPATIENT_CLINIC_OR_DEPARTMENT_OTHER): Payer: Self-pay

## 2023-10-21 DIAGNOSIS — R1030 Lower abdominal pain, unspecified: Secondary | ICD-10-CM | POA: Diagnosis not present

## 2023-10-21 DIAGNOSIS — Z3A01 Less than 8 weeks gestation of pregnancy: Secondary | ICD-10-CM | POA: Insufficient documentation

## 2023-10-21 DIAGNOSIS — O26891 Other specified pregnancy related conditions, first trimester: Secondary | ICD-10-CM | POA: Diagnosis not present

## 2023-10-21 DIAGNOSIS — R519 Headache, unspecified: Secondary | ICD-10-CM | POA: Insufficient documentation

## 2023-10-21 LAB — CBC WITH DIFFERENTIAL/PLATELET
Abs Immature Granulocytes: 0.04 10*3/uL (ref 0.00–0.07)
Basophils Absolute: 0 10*3/uL (ref 0.0–0.1)
Basophils Relative: 0 %
Eosinophils Absolute: 0.3 10*3/uL (ref 0.0–0.5)
Eosinophils Relative: 3 %
HCT: 33.8 % — ABNORMAL LOW (ref 36.0–46.0)
Hemoglobin: 11.3 g/dL — ABNORMAL LOW (ref 12.0–15.0)
Immature Granulocytes: 1 %
Lymphocytes Relative: 42 %
Lymphs Abs: 3.3 10*3/uL (ref 0.7–4.0)
MCH: 30.1 pg (ref 26.0–34.0)
MCHC: 33.4 g/dL (ref 30.0–36.0)
MCV: 90.1 fL (ref 80.0–100.0)
Monocytes Absolute: 0.6 10*3/uL (ref 0.1–1.0)
Monocytes Relative: 7 %
Neutro Abs: 3.7 10*3/uL (ref 1.7–7.7)
Neutrophils Relative %: 47 %
Platelets: 334 10*3/uL (ref 150–400)
RBC: 3.75 MIL/uL — ABNORMAL LOW (ref 3.87–5.11)
RDW: 13.2 % (ref 11.5–15.5)
WBC: 7.9 10*3/uL (ref 4.0–10.5)
nRBC: 0 % (ref 0.0–0.2)

## 2023-10-21 LAB — COMPREHENSIVE METABOLIC PANEL WITH GFR
ALT: 9 U/L (ref 0–44)
AST: 10 U/L — ABNORMAL LOW (ref 15–41)
Albumin: 4 g/dL (ref 3.5–5.0)
Alkaline Phosphatase: 69 U/L (ref 38–126)
Anion gap: 11 (ref 5–15)
BUN: 17 mg/dL (ref 6–20)
CO2: 21 mmol/L — ABNORMAL LOW (ref 22–32)
Calcium: 8.9 mg/dL (ref 8.9–10.3)
Chloride: 105 mmol/L (ref 98–111)
Creatinine, Ser: 0.68 mg/dL (ref 0.44–1.00)
GFR, Estimated: >60 mL/min (ref >60)
Glucose, Bld: 98 mg/dL (ref 70–99)
Potassium: 3.5 mmol/L (ref 3.5–5.1)
Sodium: 137 mmol/L (ref 135–145)
Total Bilirubin: <0.2 mg/dL (ref 0.0–1.2)
Total Protein: 7.5 g/dL (ref 6.5–8.1)

## 2023-10-21 LAB — HCG, QUANTITATIVE, PREGNANCY: hCG, Beta Chain, Quant, S: 1585 m[IU]/mL — ABNORMAL HIGH (ref <5)

## 2023-10-21 LAB — URINALYSIS, ROUTINE W REFLEX MICROSCOPIC
Bilirubin Urine: NEGATIVE
Glucose, UA: NEGATIVE mg/dL
Ketones, ur: NEGATIVE mg/dL
Nitrite: NEGATIVE
Protein, ur: NEGATIVE mg/dL
Specific Gravity, Urine: >=1.03 (ref 1.005–1.030)
pH: 5.5 (ref 5.0–8.0)

## 2023-10-21 LAB — URINALYSIS, MICROSCOPIC (REFLEX)

## 2023-10-21 LAB — PREGNANCY, URINE: Preg Test, Ur: POSITIVE — AB

## 2023-10-21 LAB — LIPASE, BLOOD: Lipase: 54 U/L — ABNORMAL HIGH (ref 11–51)

## 2023-10-21 NOTE — ED Triage Notes (Signed)
 Headache and abdominal pain that started a couple of days ago. Said that she missed her menstrual period this month. Her stomach feels like "something is in there".

## 2023-10-21 NOTE — Discharge Instructions (Addendum)
 He was seen today for new pregnancy, noting that the baby is approximately 3 weeks at this time.  With you not have any abdominal pain and no vaginal bleeding and with reassuring labs, recommend that you continue to follow-up with your OB/GYN for further monitoring and management of this pregnancy.  If you begin to have any bleeding, one-sided abdominal pain, lightheadedness with bleeding, shortness of breath, vision changes with headache, return to the ED for immediate evaluation.  In the meantime you can take Tylenol for your pain.  Take Tylenol (acetominophen)  650mg  every 4-6 hours, as needed for pain or fever. Do not take more than 4,000 mg in a 24-hour period. As this may cause liver damage. While this is rare, if you begin to develop yellowing of the skin or eyes, stop taking and return to ER immediately.

## 2023-10-21 NOTE — ED Provider Notes (Signed)
 [place] [facility] Provider Note   CSN: [id number] Arrival date & time: 10/21/23  2112     History  Chief Complaint  Patient presents with   Abdominal Pain    Erin Barker is a 38 y.o. female.   Abdominal Pain Patient is a 38 year old female presents ED today with complaints of lower abdominal pain, intermittent as well as intermittent headache x 5 days.  Notes of the headache has been intermittent and bandlike over the head.  Worse with movement.  Abdominal pain has been intermittent and "crampy".  Notes that she previously missed her period which was post to be on June 1.  Not on any birth control. Notes that she is G5 P4.  With all pregnancies being healthy.  Denies fever, vision changes, hearing changes, tinnitus, auditory/visual sensitivities, auras, chest pain, shortness of breath, nausea, vomiting, diarrhea, dysuria, vaginal discharge, vaginal bleeding, vaginal pruritus, lower leg swelling.    Home Medications Prior to Admission medications   Medication Sig Start Date End Date Taking? Authorizing Provider  ALPRAZolam  (XANAX ) 0.25 MG tablet Take 1 tablet (0.25 mg total) by mouth at bedtime as needed for anxiety. 01/17/23   Debbra Fairy, PA-C      Allergies    No known allergies    Review of Systems   Review of Systems  Gastrointestinal:  Positive for abdominal pain.  All other systems reviewed and are negative.   Physical Exam Updated Vital Signs BP 124/80 (BP Location: Left Arm)   Pulse 74   Temp 98.1 F (36.7 C) (Oral)   Resp 18   Ht 5\' 6"  (1.676 m)   Wt 104.3 kg   LMP 09/15/2023   SpO2 100%   BMI 37.12 kg/m  Physical Exam Vitals and nursing note reviewed.  Constitutional:      General: She is not in acute distress.    Appearance: Normal appearance. She is not ill-appearing or diaphoretic.  HENT:     Head: Normocephalic and atraumatic.  Eyes:     General: No scleral icterus.    Extraocular Movements:  Extraocular movements intact.     Conjunctiva/sclera: Conjunctivae normal.  Cardiovascular:     Rate and Rhythm: Normal rate and regular rhythm.     Pulses: Normal pulses.     Heart sounds: Normal heart sounds. No murmur heard.    No friction rub. No gallop.  Pulmonary:     Effort: Pulmonary effort is normal. No respiratory distress.     Breath sounds: Normal breath sounds. No stridor. No wheezing, rhonchi or rales.  Abdominal:     General: Abdomen is flat.     Palpations: Abdomen is soft.     Tenderness: There is no abdominal tenderness. There is no right CVA tenderness, left CVA tenderness, guarding or rebound. Negative signs include Murphy's sign, Rovsing's sign, McBurney's sign, psoas sign and obturator sign.  Skin:    General: Skin is warm and dry.  Neurological:     General: No focal deficit present.     Mental Status: She is alert. Mental status is at baseline.  Psychiatric:        Mood and Affect: Mood normal.     ED Results / Procedures / Treatments   Labs (all labs ordered are listed, but only abnormal results are displayed) Labs Reviewed  URINALYSIS, ROUTINE W REFLEX MICROSCOPIC - Abnormal; Notable for the following components:      Result Value   Hgb urine dipstick TRACE (*)  Leukocytes,Ua SMALL (*)    All other components within normal limits  PREGNANCY, URINE - Abnormal; Notable for the following components:   Preg Test, Ur POSITIVE (*)    All other components within normal limits  URINALYSIS, MICROSCOPIC (REFLEX) - Abnormal; Notable for the following components:   Bacteria, UA FEW (*)    All other components within normal limits  CBC WITH DIFFERENTIAL/PLATELET - Abnormal; Notable for the following components:   RBC 3.75 (*)    Hemoglobin 11.3 (*)    HCT 33.8 (*)    All other components within normal limits  COMPREHENSIVE METABOLIC PANEL WITH GFR - Abnormal; Notable for the following components:   CO2 21 (*)    AST 10 (*)    All other components within  normal limits  HCG, QUANTITATIVE, PREGNANCY - Abnormal; Notable for the following components:   hCG, Beta Chain, Quant, S 1,585 (*)    All other components within normal limits  LIPASE, BLOOD - Abnormal; Notable for the following components:   Lipase 54 (*)    All other components within normal limits    EKG None  Radiology No results found.  Procedures Procedures    Medications Ordered in ED Medications - No data to display  ED Course/ Medical Decision Making/ A&P                                 Medical Decision Making Amount and/or Complexity of Data Reviewed Labs: ordered.   This patient is a 38 year old female who presents to the ED for concern of lower abdominal pain and headache that both been intermittent x 5 days.  Notably missed her period  5 days ago.  On physical exam, patient is in no acute distress, afebrile, alert and orient x 4, speaking in full sentences, nontachypneic, nontachycardic.  No abdominal pain to palpation, no CVA tenderness, no chest pain to palpation.  LCTAB, no murmurs.  No lower leg edema.  Unremarkable physical exam.  Defers GU exam.  Low suspicion for ectopic, miscarriage at this time. On reevaluation after labs were drawn, lipase was noted to be mildly elevated but with her not having any abdominal pain, low suspicion and will have her follow-up with PCP for lab redraw and/or OB/GYN to have lab redraw for this.  With her also not having any abdominal pain to palpation and saying that she "feels much better" with her headache and lower abdominal pain, she is wishing to go home at this time and follow-up with her OB/GYN.  Told her that if she gets to have any new or worsening symptoms to follow-up with an ER immediately as she may have to have ectopic ruled out this point with ultrasound not being available here at this freestanding at this time.  Patient expressed agreement understanding.  Patient vital signs have remained stable throughout the  course of patient's time in the ED. Low suspicion for any other emergent pathology at this time. I believe this patient is safe to be discharged. Provided strict return to ER precautions. Patient expressed agreement and understanding of plan. All questions were answered.  Differential diagnoses prior to evaluation: The emergent differential diagnosis includes, but is not limited to, implantation, ectopic pregnancy, threatened miscarriage, diverticulitis, appendicitis, enteritis, pancreatitis. This is not an exhaustive differential.   Past Medical History / Co-morbidities / Social History: No known chronic past medical illnesses.  Additional history: Chart reviewed. Pertinent results include: Last  seen on 09/19/2023 for a encounter to establish care at PCP where she notably had her labs drawn.  Lab Tests/Imaging studies: I personally interpreted labs/imaging and the pertinent results include:   CBC notes a mild anemia with hemoglobin 11.3, similar to previous and patient is currently being treated for iron deficiency anemia by PCP. CMP unremarkable Lipase is slightly elevated at 54 UA unremarkable Pregnancy test positive hCG quant was 1585  Medications: Patient declined using Tylenol.  I have reviewed the patients home medicines and have made adjustments as needed.  Social Determinants of Health:  Reports that she has good follow-up with OB/GYN.  Disposition: After consideration of the diagnostic results and the patients response to treatment, I feel that the patient would benefit from discharge and treatment as above.   emergency department workup does not suggest an emergent condition requiring admission or immediate intervention beyond what has been performed at this time. The plan is: Follow-up with OB/GYN, return to ED for any new or worsening symptoms. The patient is safe for discharge and has been instructed to return immediately for worsening symptoms, change in symptoms or any  other concerns. Final Clinical Impression(s) / ED Diagnoses Final diagnoses:  Less than [redacted] weeks gestation of pregnancy    Rx / DC Orders ED Discharge Orders     None         Vevelyn Gowers 10/21/23 2356    Hershel Los, MD 10/22/23 1334

## 2023-11-21 ENCOUNTER — Inpatient Hospital Stay (HOSPITAL_BASED_OUTPATIENT_CLINIC_OR_DEPARTMENT_OTHER)
Admission: AD | Admit: 2023-11-21 | Discharge: 2023-11-22 | Disposition: A | Discharge status: Home / Self Care | Attending: Obstetrics and Gynecology | Admitting: Obstetrics and Gynecology

## 2023-11-21 ENCOUNTER — Encounter (HOSPITAL_BASED_OUTPATIENT_CLINIC_OR_DEPARTMENT_OTHER): Payer: Self-pay | Admitting: Emergency Medicine

## 2023-11-21 ENCOUNTER — Other Ambulatory Visit: Payer: Self-pay

## 2023-11-21 DIAGNOSIS — O3481 Maternal care for other abnormalities of pelvic organs, first trimester: Secondary | ICD-10-CM | POA: Diagnosis not present

## 2023-11-21 DIAGNOSIS — Z3A09 9 weeks gestation of pregnancy: Secondary | ICD-10-CM | POA: Insufficient documentation

## 2023-11-21 DIAGNOSIS — O3680X0 Pregnancy with inconclusive fetal viability, not applicable or unspecified: Secondary | ICD-10-CM | POA: Diagnosis not present

## 2023-11-21 DIAGNOSIS — Z3A01 Less than 8 weeks gestation of pregnancy: Secondary | ICD-10-CM

## 2023-11-21 DIAGNOSIS — O209 Hemorrhage in early pregnancy, unspecified: Secondary | ICD-10-CM | POA: Insufficient documentation

## 2023-11-21 DIAGNOSIS — O09511 Supervision of elderly primigravida, first trimester: Secondary | ICD-10-CM | POA: Diagnosis not present

## 2023-11-21 LAB — CBC
HCT: 34 % — ABNORMAL LOW (ref 36.0–46.0)
Hemoglobin: 11.1 g/dL — ABNORMAL LOW (ref 12.0–15.0)
MCH: 29.9 pg (ref 26.0–34.0)
MCHC: 32.6 g/dL (ref 30.0–36.0)
MCV: 91.6 fL (ref 80.0–100.0)
Platelets: 359 K/uL (ref 150–400)
RBC: 3.71 MIL/uL — ABNORMAL LOW (ref 3.87–5.11)
RDW: 13.5 % (ref 11.5–15.5)
WBC: 7.2 K/uL (ref 4.0–10.5)
nRBC: 0 % (ref 0.0–0.2)

## 2023-11-21 LAB — PREGNANCY, URINE: Preg Test, Ur: POSITIVE — AB

## 2023-11-21 NOTE — ED Triage Notes (Signed)
 Pt POv in wheelchair- reports vaginal bleeding x 2 hours with lower abd pain. Also having nausea and diarrhea.   Recently told she was pregnant, has yet to see OB for this pregnancy.

## 2023-11-22 ENCOUNTER — Inpatient Hospital Stay (HOSPITAL_COMMUNITY)

## 2023-11-22 DIAGNOSIS — O3680X0 Pregnancy with inconclusive fetal viability, not applicable or unspecified: Secondary | ICD-10-CM

## 2023-11-22 DIAGNOSIS — O09511 Supervision of elderly primigravida, first trimester: Secondary | ICD-10-CM | POA: Diagnosis not present

## 2023-11-22 DIAGNOSIS — Z3A01 Less than 8 weeks gestation of pregnancy: Secondary | ICD-10-CM | POA: Diagnosis not present

## 2023-11-22 DIAGNOSIS — O209 Hemorrhage in early pregnancy, unspecified: Secondary | ICD-10-CM | POA: Diagnosis not present

## 2023-11-22 LAB — BASIC METABOLIC PANEL WITH GFR
Anion gap: 11 (ref 5–15)
BUN: 16 mg/dL (ref 6–20)
CO2: 21 mmol/L — ABNORMAL LOW (ref 22–32)
Calcium: 8.4 mg/dL — ABNORMAL LOW (ref 8.9–10.3)
Chloride: 106 mmol/L (ref 98–111)
Creatinine, Ser: 0.71 mg/dL (ref 0.44–1.00)
GFR, Estimated: >60 mL/min (ref >60)
Glucose, Bld: 96 mg/dL (ref 70–99)
Potassium: 3.8 mmol/L (ref 3.5–5.1)
Sodium: 138 mmol/L (ref 135–145)

## 2023-11-22 LAB — ABO/RH: ABO/RH(D): AB POS

## 2023-11-22 LAB — HCG, QUANTITATIVE, PREGNANCY: hCG, Beta Chain, Quant, S: 5396 m[IU]/mL — ABNORMAL HIGH (ref <5)

## 2023-11-22 NOTE — MAU Provider Note (Signed)
 History     CSN: [id number]  Arrival date and time: 11/21/23 1935   None     Chief Complaint  Patient presents with   Vaginal Bleeding   HPI Patient presented to the emergency department for vaginal bleeding in early pregnancy.  Patient reported LMP was May 1 and that she has no OB/GYN because she just moved back to the area.  While at the outside emergency department they did a workup showing normal CBC and hCG level of 5396.  AB+ blood type.  Unfortunately no available ultrasonographers of the outside freestanding ED so patient transferred to the MAU for ultrasound.  OB History     Gravida  2   Para      Term      Preterm      AB      Living         SAB      IAB      Ectopic      Multiple      Live Births              History reviewed. No pertinent past medical history.  History reviewed. No pertinent surgical history.  History reviewed. No pertinent family history.  Social History   Tobacco Use   Smoking status: Never   Smokeless tobacco: Never  Vaping Use   Vaping status: Never Used  Substance Use Topics   Alcohol use: Yes    Comment: social, once a month maybe 2 shots   Drug use: Never    Allergies:  Allergies  Allergen Reactions   No Known Allergies     Medications Prior to Admission  Medication Sig Dispense Refill Last Dose/Taking   ALPRAZolam  (XANAX ) 0.25 MG tablet Take 1 tablet (0.25 mg total) by mouth at bedtime as needed for anxiety. 5 tablet 0     Review of Systems  Gastrointestinal:  Positive for abdominal pain.  Genitourinary:  Positive for vaginal bleeding.   Physical Exam   Blood pressure 116/75, pulse 67, temperature 98.1 F (36.7 C), temperature source Oral, resp. rate 20, height 5' 6 (1.676 m), weight 104.3 kg, last menstrual period 09/15/2023, SpO2 100%, currently breastfeeding.  Physical Exam Vitals and nursing note reviewed.  Constitutional:      Appearance: Normal appearance.  HENT:     Head:  Normocephalic and atraumatic.     Nose: No congestion or rhinorrhea.  Eyes:     Extraocular Movements: Extraocular movements intact.  Cardiovascular:     Rate and Rhythm: Normal rate.  Pulmonary:     Effort: Pulmonary effort is normal.  Abdominal:     Palpations: Abdomen is soft.     Tenderness: There is no abdominal tenderness.  Musculoskeletal:        General: Normal range of motion.     Cervical back: Normal range of motion.  Skin:    General: Skin is warm.     Capillary Refill: Capillary refill takes less than 2 seconds.  Neurological:     General: No focal deficit present.     Mental Status: She is alert.     Cranial Nerves: No cranial nerve deficit.  Psychiatric:        Mood and Affect: Mood normal.        Behavior: Behavior normal.     MAU Course  Procedures  MDM Ultrasound  Assessment and Plan  Erin Barker is a 38 year old G6, P4@unknown  gestation presenting for vaginal bleeding in early pregnancy.  Pregnancy of unknown location Patient presenting with vaginal bleeding for the last 24 hours.  Went to [facility] ED beta-hCG [phone].  1 month ago it was 1585.  Ultrasound here showing no intrauterine gestational sac, yolk sac, or fetal pole.  Recommended trending of hCG and follow-up ultrasound in 10 to 14 days.  Discussed with patient this is likely miscarriage but we do need to trend the hCG levels.  She will return in 48 hours for follow-up hCG.  Discussed strict ectopic precautions.  Patient discharged and will return in 48 hours for hCG levels.  Eston Heslin V Marjie Chea 11/22/2023, 2:50 AM

## 2023-11-22 NOTE — ED Provider Notes (Signed)
 [place] [facility] Provider Note   CSN: [id number] Arrival date & time: 11/21/23  1935     Patient presents with: Vaginal Bleeding   Erin Barker is a 38 y.o. female.   The history is provided by the patient.  Vaginal Bleeding Quality:  Bright red Severity:  Moderate Onset quality:  Gradual Duration:  1 day Timing:  Constant Progression:  Unchanged Chronicity:  New Possible pregnancy: yes (LMP may 1st patient is G6P4, no OB gyn as she just moved back to the area)   Context: spontaneously   Relieved by:  Nothing Worsened by:  Nothing Ineffective treatments:  None tried Associated symptoms: no fever   Risk factors: no ovarian torsion        Prior to Admission medications   Medication Sig Start Date End Date Taking? Authorizing Provider  ALPRAZolam  (XANAX ) 0.25 MG tablet Take 1 tablet (0.25 mg total) by mouth at bedtime as needed for anxiety. 01/17/23   Nivia Colon, PA-C    Allergies: No known allergies    Review of Systems  Constitutional:  Negative for fever.  Respiratory:  Negative for wheezing and stridor.   Genitourinary:  Positive for vaginal bleeding.  All other systems reviewed and are negative.   Updated Vital Signs BP 111/70   Pulse 63   Temp 98.1 F (36.7 C)   Resp 16   Ht 5' 6 (1.676 m)   Wt 104.3 kg   LMP 09/15/2023   SpO2 100%   Breastfeeding Yes   BMI 37.12 kg/m   Physical Exam Vitals and nursing note reviewed.  Constitutional:      General: She is not in acute distress.    Appearance: She is well-developed.  HENT:     Head: Normocephalic and atraumatic.     Nose: Nose normal.  Eyes:     Pupils: Pupils are equal, round, and reactive to light.  Cardiovascular:     Rate and Rhythm: Normal rate and regular rhythm.     Pulses: Normal pulses.     Heart sounds: Normal heart sounds.  Pulmonary:     Effort: Pulmonary effort is normal. No respiratory distress.     Breath sounds: Normal  breath sounds.  Abdominal:     General: Bowel sounds are normal. There is no distension.     Palpations: Abdomen is soft.     Tenderness: There is no abdominal tenderness. There is no guarding or rebound.  Musculoskeletal:        General: Normal range of motion.     Cervical back: Neck supple.  Skin:    Findings: No erythema or rash.  Neurological:     Mental Status: She is oriented to person, place, and time.     (all labs ordered are listed, but only abnormal results are displayed) Labs Reviewed  PREGNANCY, URINE - Abnormal; Notable for the following components:      Result Value   Preg Test, Ur POSITIVE (*)    All other components within normal limits  CBC - Abnormal; Notable for the following components:   RBC 3.71 (*)    Hemoglobin 11.1 (*)    HCT 34.0 (*)    All other components within normal limits  HCG, QUANTITATIVE, PREGNANCY - Abnormal; Notable for the following components:   hCG, Beta Chain, Quant, S 5,396 (*)    All other components within normal limits  BASIC METABOLIC PANEL WITH GFR - Abnormal; Notable for the following components:  CO2 21 (*)    Calcium 8.4 (*)    All other components within normal limits  ABO/RH    EKG: None  Radiology: No results found.   Procedures   Medications Ordered in the ED - No data to display                                  Medical Decision Making Patient is G6P4 LMP may 1  Amount and/or Complexity of Data Reviewed Independent Historian: spouse    Details: See above  External Data Reviewed: notes.    Details: Care everywhere accessed  Labs: ordered.    Details: Pregnancy test is positive.  Beta HCG is 5396.  ABpositive normal white count.  Hemoglobin slight low 11.1, normal platlets.   Radiology: ordered.    Details: US  ordered not available here  Discussion of management or test interpretation with external provider(s): Case d/w Dr. Abigail of OB who agrees to accept please secure chat Dr. Ilean. I have done  this.       Final diagnoses:  Pregnancy of unknown anatomic location   EMTAL complete.  Patient POV to MAU for US .  This has been ordered in [facility] ED Discharge Orders     None          Rhythm Gubbels, MD 11/22/23 [phone]

## 2023-11-22 NOTE — MAU Note (Signed)
..  Erin Barker is a 38 y.o. at Unknown here in MAU reporting from [employer]: abdominal cramping that began yesterday. Took ibuprofen yesterday morning for the pain and it eased the pain but it returned.   Vaginal bleeding has gone through 4 pads (moderate) in the past 24 hours. Pain score: 8/10 Vitals:   11/22/23 0202 11/22/23 0250  BP: 130/81 116/75  Pulse: 72 67  Resp: 20   Temp: 98.1 F (36.7 C)   SpO2: 100%

## 2023-11-22 NOTE — Discharge Instructions (Signed)
 It was a pleasure taking care of you tonight.  I am sorry but your bleeding is likely related to a miscarriage but because we did not see a pregnancy in your uterus we need to follow-up with hCG levels in 48 hours and likely a repeat ultrasound in 14 days.  I will send a message to our clinic to have you scheduled for viability ultrasound in 14 days and have you scheduled for a lab visit in 48 hours at the clinic.  This will be coming to your MyChart.  If your abdominal pain worsens please return for further evaluation.

## 2024-01-04 DIAGNOSIS — R079 Chest pain, unspecified: Secondary | ICD-10-CM | POA: Diagnosis not present

## 2024-01-04 NOTE — Progress Notes (Signed)
 Subjective Patient ID: Erin Barker is a 38 y.o. female.  Chief Complaint  Patient presents with  . Chest Pain  . Shoulder Pain  . Arm Pain    Patient states she had an episode of chest pain two days ago. Patient states she no longer has chest pain but currently has pain in her shoulder blades and right arm.     The following information was reviewed by members of the visit team:  Tobacco  Allergies  Meds  Med Hx  Surg Hx  OB Status  Fam Hx  Soc  Hx     38 year old female who presents for evaluation of chest discomfort, shoulder and arm pain.  States 2 days ago she had 1 episode of having a feeling in the middle of chest.  States symptoms lasted for a few seconds and resolved.  She has not had any further chest discomfort or pain.  She has also noticed pain in bilateral neck which radiates to the shoulders and shoulder blade area.  Pain is worse with movement.  Denies injury to the area.  No associated numbness, tingling or pain in the arms.  No recent injury to neck or shoulders.  No fever or chills.  She denies associated shortness of breath or pleuritic pain.  She has not taken any medications for her symptoms as yet     Review of Systems  Constitutional:  Negative for chills and fever.  Respiratory:  Negative for chest tightness and shortness of breath.   Cardiovascular:  Positive for chest pain. Negative for palpitations.  Gastrointestinal:  Negative for abdominal pain, nausea and vomiting.  Musculoskeletal:  Positive for neck pain. Negative for arthralgias, back pain, myalgias and neck stiffness.  Skin:  Negative for rash.  Neurological:  Negative for dizziness, numbness and headaches.  Hematological:  Negative for adenopathy. Does not bruise/bleed easily.    Objective Physical Exam Vitals reviewed.  Constitutional:      General: She is not in acute distress.    Appearance: Normal appearance. She is not ill-appearing, toxic-appearing or diaphoretic.  Neck:      Comments: Mild tenderness to palpation bilateral neck and trapezius muscles Cardiovascular:     Rate and Rhythm: Normal rate and regular rhythm.     Pulses: Normal pulses.     Heart sounds: Normal heart sounds.  Pulmonary:     Effort: Pulmonary effort is normal. No respiratory distress.     Breath sounds: Normal breath sounds.   Musculoskeletal:     Cervical back: Normal range of motion and neck supple. No rigidity.   Skin:    General: Skin is warm and dry.     Capillary Refill: Capillary refill takes less than 2 seconds.     Findings: No rash.   Neurological:     Mental Status: She is alert and oriented to person, place, and time.   Psychiatric:        Mood and Affect: Mood normal.    EKG: Normal sinus rhythm BPM: 63 Axis: NonspecificT waves    NO ST elevation or Depression, no acute ischemia Good R wave progression No Q waves No previous EKG to compare with  abnormal EKG  Assessment/Plan  38 year old female with with episode of chest discomfort 2 days ago along with soreness in trapezius muscles EKG performed due to chest discomfort, shows normal sinus rhythm, rate of 63 with no acute ischemic change Symptoms and exam consistent with strain of the trapezius muscles. Vital signs are within normal limits,  patient in no acute distress. There is low risk for cardiac sources of pain, low risk for PE.  Recommend rest, ibuprofen or Aleve, heat as needed for symptoms , And follow-up with PCP as needed for persistent symptoms Urgent Care Disposition:  Home Care  Electronically signed: Franky Floria Finder, PA-C 01/04/2024  2:26 PM

## 2024-01-10 ENCOUNTER — Ambulatory Visit: Payer: Self-pay | Admitting: Family Medicine

## 2024-01-10 ENCOUNTER — Inpatient Hospital Stay (HOSPITAL_COMMUNITY)
Admission: AD | Admit: 2024-01-10 | Discharge: 2024-01-10 | Disposition: A | Discharge status: Home / Self Care | Attending: Family Medicine | Admitting: Family Medicine

## 2024-01-10 ENCOUNTER — Encounter (HOSPITAL_COMMUNITY): Payer: Self-pay | Admitting: Obstetrics and Gynecology

## 2024-01-10 ENCOUNTER — Inpatient Hospital Stay (HOSPITAL_COMMUNITY)

## 2024-01-10 ENCOUNTER — Other Ambulatory Visit: Payer: Self-pay

## 2024-01-10 DIAGNOSIS — O3680X0 Pregnancy with inconclusive fetal viability, not applicable or unspecified: Secondary | ICD-10-CM | POA: Diagnosis not present

## 2024-01-10 DIAGNOSIS — Z3A16 16 weeks gestation of pregnancy: Secondary | ICD-10-CM

## 2024-01-10 DIAGNOSIS — Z3201 Encounter for pregnancy test, result positive: Secondary | ICD-10-CM | POA: Insufficient documentation

## 2024-01-10 DIAGNOSIS — O26899 Other specified pregnancy related conditions, unspecified trimester: Secondary | ICD-10-CM | POA: Diagnosis not present

## 2024-01-10 DIAGNOSIS — Z113 Encounter for screening for infections with a predominantly sexual mode of transmission: Secondary | ICD-10-CM | POA: Diagnosis present

## 2024-01-10 DIAGNOSIS — N83202 Unspecified ovarian cyst, left side: Secondary | ICD-10-CM | POA: Diagnosis not present

## 2024-01-10 DIAGNOSIS — O3480 Maternal care for other abnormalities of pelvic organs, unspecified trimester: Secondary | ICD-10-CM | POA: Diagnosis not present

## 2024-01-10 DIAGNOSIS — O034 Incomplete spontaneous abortion without complication: Secondary | ICD-10-CM | POA: Diagnosis not present

## 2024-01-10 DIAGNOSIS — O09519 Supervision of elderly primigravida, unspecified trimester: Secondary | ICD-10-CM | POA: Diagnosis not present

## 2024-01-10 DIAGNOSIS — R102 Pelvic and perineal pain: Secondary | ICD-10-CM | POA: Diagnosis present

## 2024-01-10 LAB — COMPREHENSIVE METABOLIC PANEL WITH GFR
ALT: 11 U/L (ref 0–44)
AST: 12 U/L — ABNORMAL LOW (ref 15–41)
Albumin: 3.4 g/dL — ABNORMAL LOW (ref 3.5–5.0)
Alkaline Phosphatase: 52 U/L (ref 38–126)
Anion gap: 7 (ref 5–15)
BUN: 11 mg/dL (ref 6–20)
CO2: 23 mmol/L (ref 22–32)
Calcium: 8.2 mg/dL — ABNORMAL LOW (ref 8.9–10.3)
Chloride: 102 mmol/L (ref 98–111)
Creatinine, Ser: 0.88 mg/dL (ref 0.44–1.00)
GFR, Estimated: >60 mL/min (ref >60)
Glucose, Bld: 96 mg/dL (ref 70–99)
Potassium: 3.7 mmol/L (ref 3.5–5.1)
Sodium: 132 mmol/L — ABNORMAL LOW (ref 135–145)
Total Bilirubin: 0.5 mg/dL (ref 0.0–1.2)
Total Protein: 7.1 g/dL (ref 6.5–8.1)

## 2024-01-10 LAB — URINALYSIS, ROUTINE W REFLEX MICROSCOPIC
Bilirubin Urine: NEGATIVE
Glucose, UA: NEGATIVE mg/dL
Ketones, ur: NEGATIVE mg/dL
Nitrite: NEGATIVE
Protein, ur: NEGATIVE mg/dL
Specific Gravity, Urine: 1.025 (ref 1.005–1.030)
pH: 5 (ref 5.0–8.0)

## 2024-01-10 LAB — WET PREP, GENITAL
Clue Cells Wet Prep HPF POC: NONE SEEN
Sperm: NONE SEEN
Trich, Wet Prep: NONE SEEN
WBC, Wet Prep HPF POC: >=10 — AB (ref <10)
Yeast Wet Prep HPF POC: NONE SEEN

## 2024-01-10 LAB — CBC
HCT: 37.6 % (ref 36.0–46.0)
Hemoglobin: 12 g/dL (ref 12.0–15.0)
MCH: 30.2 pg (ref 26.0–34.0)
MCHC: 31.9 g/dL (ref 30.0–36.0)
MCV: 94.5 fL (ref 80.0–100.0)
Platelets: 322 K/uL (ref 150–400)
RBC: 3.98 MIL/uL (ref 3.87–5.11)
RDW: 12.9 % (ref 11.5–15.5)
WBC: 6.9 K/uL (ref 4.0–10.5)
nRBC: 0 % (ref 0.0–0.2)

## 2024-01-10 LAB — HCG, QUANTITATIVE, PREGNANCY: hCG, Beta Chain, Quant, S: 20 m[IU]/mL — ABNORMAL HIGH (ref <5)

## 2024-01-10 NOTE — MAU Note (Signed)
 Erin Barker is a 38 y.o. at Unknown here in MAU reporting: pt believed that she miscarried in July- HCG >5000 with no FU- US  inconclusive- but continues to have pos UPT at home. Pt reports increased abd pain. Denies VB or LOF  LMP: 09/21/23 Onset of complaint: last week Pain score: 3/10 Vitals:   01/10/24 1429  BP: (!) 137/91  Pulse: 73  Temp: 98.2 F (36.8 C)  SpO2: 100%     FHT: na  Lab orders placed from triage: hcg quant

## 2024-01-10 NOTE — Telephone Encounter (Signed)
 Copied from CRM #[id number]. Topic: Schedule Appointment - Schedule Patient >> Jan 10, 2024  1:56 PM Darice ORN wrote: Erin Barker is calling for clinical concerns (Ask: What symptoms are you calling about today, AND how long have you had these symptoms? Must Review HPKW list for symptoms) Document Name of Triage Nurse/BH Rep taking the call when applicable)   Include all details related to the request(s) below: Patient is requesting to be worked in today  She would like to have an Ultrasound ordered due to: miscarried in July, still no period, pregnancy test showing faint positive     Confirm and type the Best Contact Number below:  Patient/caller contact number:          [phone]   [] Home  [x] Mobile  [] Work  []  Other   [x]  Okay to leave a voicemail   Medication List:  Current Outpatient Medications:  .  ergocalciferol (VITAMIN D2) 1,250 mcg (50,000 unit) capsule, Take 1 capsule (50,000 Units total) by mouth once a week., Disp: 12 capsule, Rfl: 3     Medication Request/Refills: Pharmacy Information (if applicable)   []  Not Applicable       []  Pharmacy listed  Send Medication Request to:                                                 []  Pharmacy not listed (added to pharmacy list in Epic) Send Medication Request to:      Listed Pharmacies: [id number] [place], [place] - [address] AT [facility] OF [facility] & [address] - PHONE: [phone] - FAX: [phone]

## 2024-01-10 NOTE — MAU Provider Note (Signed)
 Chief Complaint: Abdominal Pain and continued pos preg test- poss miscarriage in July.    Event Date/Time   First Provider Initiated Contact with Patient 01/10/24 1452     SUBJECTIVE HPI: Erin Barker is a 38 y.o. [id number] at [redacted]w[redacted]d who presents to Maternity Admissions reporting positive home pregnancy x 3 after her MAU visit on 11/22/23 which US  show likely miscarriage.  Patient reported  During the visit her workup showing normal CBC and hCG level of 5396. LMP was May 1. Her LMP was 09/15/23. 2 weeks ago she had un-protect intercourse which prompt her to take Plan B contraceptive. She denies vaginal bleeding, discharge, dizziness, abdominal pain, and has not yet having a regular period.     OB History  Gravida Para Term Preterm AB Living  5 4 4   4   SAB IAB Ectopic Multiple Live Births      4    # Outcome Date GA Lbr Len/2nd Weight Sex Type Anes PTL Lv  5 Current           4 Term 05/30/22     Vag-Spont  N LIV  3 Term 01/08/17     Vag-Spont  N LIV  2 Term 03/13/09     Vag-Spont  N LIV  1 Term 01/19/06     Vag-Spont  N LIV   No past surgical history on file. Social History   Socioeconomic History   Marital status: Legally Separated    Spouse name: Not on file   Number of children: Not on file   Years of education: Not on file   Highest education level: Not on file  Occupational History   Not on file  Tobacco Use   Smoking status: Never   Smokeless tobacco: Never  Vaping Use   Vaping status: Never Used  Substance and Sexual Activity   Alcohol use: Yes    Comment: social, once a month maybe 2 shots   Drug use: Never   Sexual activity: Yes    Birth control/protection: None, Condom  Other Topics Concern   Not on file  Social History Narrative   Not on file   Social Drivers of Health   Financial Resource Strain: Not on file  Food Insecurity: Low Risk  (09/19/2023)   Received from [facility]   Hunger Vital Sign    Within the past 12 months, you worried that your  food would run out before you got money to buy more: Never true    Within the past 12 months, the food you bought just didn't last and you didn't have money to get more. : Never true  Transportation Needs: No Transportation Needs (09/19/2023)   Received from [facility]    In the past 12 months, has lack of reliable transportation kept you from medical appointments, meetings, work or from getting things needed for daily living? : No  Physical Activity: Not on file  Stress: Not on file  Social Connections: Not on file  Intimate Partner Violence: Not on file   No family history on file. No current facility-administered medications on file prior to encounter.   Current Outpatient Medications on File Prior to Encounter  Medication Sig Dispense Refill   ALPRAZolam  (XANAX ) 0.25 MG tablet Take 1 tablet (0.25 mg total) by mouth at bedtime as needed for anxiety. 5 tablet 0   Allergies  Allergen Reactions   No Known Allergies     I have reviewed patient's Past Medical Hx, Surgical  Hx, Family Hx, Social Hx, medications and allergies.   Review of Systems  Respiratory: Negative.    Cardiovascular: Negative.   Gastrointestinal: Negative.   Genitourinary:  Positive for pelvic pain (mild pelvic pressure).    OBJECTIVE Patient Vitals for the past 24 hrs:  BP Temp Temp src Pulse SpO2 Height Weight  01/10/24 1429 (!) 137/91 98.2 F (36.8 C) Oral 73 100 % 5' 6 (1.676 m) 109.9 kg   Constitutional: Well-developed, well-nourished female in no acute distress.  Cardiovascular: normal rate & rhythm, no murmur Respiratory: normal rate and effort. Lung sounds clear throughout GI: Abd soft, non-tender, Pos BS x 4. No guarding or rebound tenderness MS: Extremities nontender, no edema, normal ROM Neurologic: Alert and oriented x 4.      LAB RESULTS Results for orders placed or performed during the hospital encounter of 01/10/24 (from the past 24 hours)  Wet prep, genital      Status: Abnormal   Collection Time: 01/10/24  2:32 PM   Specimen: PATH Cytology Cervicovaginal Ancillary Only  Result Value Ref Range   Yeast Wet Prep HPF POC NONE SEEN NONE SEEN   Trich, Wet Prep NONE SEEN NONE SEEN   Clue Cells Wet Prep HPF POC NONE SEEN NONE SEEN   WBC, Wet Prep HPF POC >=10 (A) <10   Sperm NONE SEEN   hCG, quantitative, pregnancy     Status: Abnormal   Collection Time: 01/10/24  2:54 PM  Result Value Ref Range   hCG, Beta Chain, Quant, S 20 (H) <5 mIU/mL  CBC     Status: None   Collection Time: 01/10/24  2:54 PM  Result Value Ref Range   WBC 6.9 4.0 - 10.5 K/uL   RBC 3.98 3.87 - 5.11 MIL/uL   Hemoglobin 12.0 12.0 - 15.0 g/dL   HCT 62.3 63.9 - 53.9 %   MCV 94.5 80.0 - 100.0 fL   MCH 30.2 26.0 - 34.0 pg   MCHC 31.9 30.0 - 36.0 g/dL   RDW 87.0 88.4 - 84.4 %   Platelets 322 150 - 400 K/uL   nRBC 0.0 0.0 - 0.2 %  Comprehensive metabolic panel     Status: Abnormal   Collection Time: 01/10/24  2:54 PM  Result Value Ref Range   Sodium 132 (L) 135 - 145 mmol/L   Potassium 3.7 3.5 - 5.1 mmol/L   Chloride 102 98 - 111 mmol/L   CO2 23 22 - 32 mmol/L   Glucose, Bld 96 70 - 99 mg/dL   BUN 11 6 - 20 mg/dL   Creatinine, Ser 9.11 0.44 - 1.00 mg/dL   Calcium 8.2 (L) 8.9 - 10.3 mg/dL   Total Protein 7.1 6.5 - 8.1 g/dL   Albumin 3.4 (L) 3.5 - 5.0 g/dL   AST 12 (L) 15 - 41 U/L   ALT 11 0 - 44 U/L   Alkaline Phosphatase 52 38 - 126 U/L   Total Bilirubin 0.5 0.0 - 1.2 mg/dL   GFR, Estimated >39 >39 mL/min   Anion gap 7 5 - 15  Urinalysis, Routine w reflex microscopic -Urine, Clean Catch     Status: Abnormal   Collection Time: 01/10/24  3:16 PM  Result Value Ref Range   Color, Urine YELLOW YELLOW   APPearance CLOUDY (A) CLEAR   Specific Gravity, Urine 1.025 1.005 - 1.030   pH 5.0 5.0 - 8.0   Glucose, UA NEGATIVE NEGATIVE mg/dL   Hgb urine dipstick MODERATE (A) NEGATIVE   Bilirubin  Urine NEGATIVE NEGATIVE   Ketones, ur NEGATIVE NEGATIVE mg/dL   Protein, ur NEGATIVE  NEGATIVE mg/dL   Nitrite NEGATIVE NEGATIVE   Leukocytes,Ua MODERATE (A) NEGATIVE   RBC / HPF 0-5 0 - 5 RBC/hpf   WBC, UA 0-5 0 - 5 WBC/hpf   Bacteria, UA RARE (A) NONE SEEN   Squamous Epithelial / HPF 21-50 0 - 5 /HPF   Mucus PRESENT     IMAGING US  OB Transvaginal Result Date: 01/10/2024 CLINICAL DATA:  Abdominal pain, positive pregnancy test. EXAM: TRANSVAGINAL OB ULTRASOUND TECHNIQUE: Transvaginal ultrasound was performed for complete evaluation of the gestation as well as the maternal uterus, adnexal regions, and pelvic cul-de-sac. COMPARISON:  November 22, 2023. FINDINGS: Intrauterine gestational sac: None Yolk sac:  Not Visualized. Embryo:  Not Visualized. Cardiac Activity: Not Visualized. Subchorionic hemorrhage:  None visualized. Maternal uterus/adnexae: Right ovary is unremarkable. No free fluid is noted. 5.6 cm left ovarian cyst is noted. Endometrium is heterogeneous and vascular.   IMPRESSION:  No intrauterine gestational sac, yolk sac, fetal pole, or cardiac activity visualized. 5.6 cm left ovarian cyst is noted. Endometrium does appear to be heterogeneous and vascular. Differential considerations include intrauterine gestation too early to be sonographically visualized, spontaneous abortion, or ectopic pregnancy. Consider follow-up ultrasound in 14 days and serial quantitative beta HCG follow-up.   Electronically Signed   By: Lynwood Landy Raddle M.D.   On: 01/10/2024 16:27    MAU COURSE Orders Placed This Encounter  Procedures   Wet prep, genital   US  OB Transvaginal   hCG, quantitative, pregnancy   CBC   Comprehensive metabolic panel   Urinalysis, Routine w reflex microscopic -Urine, Clean Catch   Discharge patient   No orders of the defined types were placed in this encounter.   MDM - Wet Prep - US  OB Transvaginal - hCG, CBC, CMP - UA  ASSESSMENT/PLAN 1. Retained products of conception after miscarriage   2. Positive pregnancy test    1. Positive pregnancy test  (Primary) 2. Retained products of conception after miscarriage Patient states she was bleeding 4 pads daily for 1 weeks after + pregnancy in July. Since then she has been spotting. US  today show No intrauterine gestational sac, yolk sac, fetal pole, or cardiac activity visualized. 5.6 cm left ovarian cyst is noted. hCG of 20 - follow up with hCG in 48 hrs on 01/12/24  - follow up US  in 14 weeks scheduled on 01/19/24    Allergies as of 01/10/2024       Reactions   No Known Allergies         Medication List     TAKE these medications    ALPRAZolam  0.25 MG tablet Commonly known as: XANAX  Take 1 tablet (0.25 mg total) by mouth at bedtime as needed for anxiety.         Suzen Houston NOVAK, DO PGY1 Family Medicine Resident 01/10/2024 6:09 PM

## 2024-01-11 ENCOUNTER — Other Ambulatory Visit: Payer: Self-pay | Admitting: Family Medicine

## 2024-01-11 DIAGNOSIS — Z3201 Encounter for pregnancy test, result positive: Secondary | ICD-10-CM

## 2024-01-11 DIAGNOSIS — O034 Incomplete spontaneous abortion without complication: Secondary | ICD-10-CM

## 2024-01-11 LAB — GC/CHLAMYDIA PROBE AMP (~~LOC~~) NOT AT ARMC
Chlamydia: NEGATIVE
Comment: NEGATIVE
Comment: NORMAL
Neisseria Gonorrhea: NEGATIVE

## 2024-01-12 ENCOUNTER — Ambulatory Visit: Payer: Self-pay

## 2024-01-23 ENCOUNTER — Other Ambulatory Visit

## 2024-01-24 ENCOUNTER — Ambulatory Visit: Payer: Self-pay | Admitting: Family Medicine

## 2024-01-24 ENCOUNTER — Other Ambulatory Visit: Payer: Self-pay | Admitting: Family Medicine

## 2024-01-24 ENCOUNTER — Other Ambulatory Visit: Payer: Self-pay

## 2024-01-24 ENCOUNTER — Ambulatory Visit: Admitting: *Deleted

## 2024-01-24 ENCOUNTER — Ambulatory Visit (INDEPENDENT_AMBULATORY_CARE_PROVIDER_SITE_OTHER)

## 2024-01-24 DIAGNOSIS — Z3A00 Weeks of gestation of pregnancy not specified: Secondary | ICD-10-CM | POA: Diagnosis not present

## 2024-01-24 DIAGNOSIS — Z3201 Encounter for pregnancy test, result positive: Secondary | ICD-10-CM

## 2024-01-24 DIAGNOSIS — O3680X0 Pregnancy with inconclusive fetal viability, not applicable or unspecified: Secondary | ICD-10-CM | POA: Diagnosis not present

## 2024-01-24 DIAGNOSIS — O034 Incomplete spontaneous abortion without complication: Secondary | ICD-10-CM

## 2024-01-24 NOTE — Progress Notes (Signed)
 Patient here for viability US . Notified by sonographer patient has abnormal results. Reviewed history and preliminary US  results with Dr. Eveline which shows no gestational sac, no yolk sac, no fetal pole. US  does show hetero appearing endo with hypervascularity , concern for retained products. Dr. Eveline advises non stat bhcg today and provider visit next week.  I reviewed results and plan with patient. She reports no pain and that she bled heavy 9/1 and then since then just light to medium bleeding- changing pad 2-3 times per day.  I advised if she has severe pain, fever, or heavy bleeding to go to hospital for evaluation and stressed otherwise very important to keep appointment next week. She voices understanding. Rock Skip PEAK

## 2024-01-25 LAB — BETA HCG QUANT (REF LAB): hCG Quant: <1 m[IU]/mL

## 2024-01-27 DIAGNOSIS — S20222A Contusion of left back wall of thorax, initial encounter: Secondary | ICD-10-CM | POA: Diagnosis not present

## 2024-01-27 DIAGNOSIS — M546 Pain in thoracic spine: Secondary | ICD-10-CM | POA: Diagnosis not present

## 2024-01-30 ENCOUNTER — Encounter: Payer: Self-pay | Admitting: Family Medicine

## 2024-01-30 ENCOUNTER — Other Ambulatory Visit: Payer: Self-pay

## 2024-01-30 ENCOUNTER — Ambulatory Visit: Admitting: Family Medicine

## 2024-01-30 VITALS — BP 125/86 | HR 71 | Wt 239.9 lb

## 2024-01-30 DIAGNOSIS — R300 Dysuria: Secondary | ICD-10-CM

## 2024-01-30 DIAGNOSIS — N939 Abnormal uterine and vaginal bleeding, unspecified: Secondary | ICD-10-CM | POA: Diagnosis not present

## 2024-01-30 DIAGNOSIS — M7552 Bursitis of left shoulder: Secondary | ICD-10-CM | POA: Diagnosis not present

## 2024-01-30 DIAGNOSIS — S40012A Contusion of left shoulder, initial encounter: Secondary | ICD-10-CM | POA: Diagnosis not present

## 2024-01-30 DIAGNOSIS — O034 Incomplete spontaneous abortion without complication: Secondary | ICD-10-CM

## 2024-01-30 DIAGNOSIS — Z3A16 16 weeks gestation of pregnancy: Secondary | ICD-10-CM

## 2024-01-30 DIAGNOSIS — Z1331 Encounter for screening for depression: Secondary | ICD-10-CM

## 2024-01-30 LAB — POCT URINALYSIS DIP (DEVICE)
Bilirubin Urine: NEGATIVE
Glucose, UA: NEGATIVE mg/dL
Ketones, ur: NEGATIVE mg/dL
Nitrite: NEGATIVE
Protein, ur: NEGATIVE mg/dL
Specific Gravity, Urine: >=1.03 (ref 1.005–1.030)
Urobilinogen, UA: 0.2 mg/dL (ref 0.0–1.0)
pH: 6.5 (ref 5.0–8.0)

## 2024-01-30 LAB — BETA HCG QUANT (REF LAB): hCG Quant: <2 m[IU]/mL

## 2024-01-30 NOTE — Addendum Note (Signed)
 Addended by: WALLACE SEARCH on: 01/30/2024 02:42 PM   Modules accepted: Orders

## 2024-01-30 NOTE — Progress Notes (Signed)
   Subjective:   Patient Name: Erin Barker, female   DOB: [birth year], 38 y.o.  MRN: [id number]  HPI Seen for follow up of SAB. Initial SAB with MAU visit on 11/22/2023. Still having spotting on 01/10/2024 so reevaluated in MAU at that time with hCG 20 and no IUP on US . Denies cramping, fever, pain. Still seeing spotting with wiping and needing to wear a pad for scant bleeding. Menses has not returned yet. Does not plan on becoming pregnant soon. Emotionally, she has been doing well. She is not actively trying to conceive, but she is not opposed to getting pregnant. She is not interested in contraception at this time. Her concern is that she is still bleeding and had unprotected sex on 01/09/2024. She would like to avoid a D&C. She also notes dysuria and would like to make sure she does not have a UTI.  Review of Systems See HPI    Objective:   Physical Exam  Constitutional: She is oriented to person, place, and time. She appears well-developed and well-nourished.  Cardiovascular: Normal rate.  Abdominal: Soft. She exhibits no distension.  Neurological: She is alert and oriented to person, place, and time.  Skin: Skin is warm and dry.  Psychiatric: She has a normal mood and affect. Her behavior is normal. Judgment and thought content normal.      Assessment & Plan:  1. Vaginal spotting (Primary) Discussed repeating hCG. If negative for pregnancy, discussed options of expectant management vs Cytotec. Patient prefers expectant management. Follow up in 1-2 weeks. - Beta hCG quant (ref lab)  2. Retained products of conception after miscarriage Negative hCG on 9/9. Repeating today due to patient concerns of new pregnancy.  - Beta hCG quant (ref lab)  3. Dysuria - Urinalysis, dipstick only - Urine Culture   Patient counseled regarding future pregnancy plans and timing  Contraception: declines  Joesph DELENA Sear PA-C 01/30/24  1:35 PM

## 2024-01-30 NOTE — Addendum Note (Signed)
 Addended by: LENNIE RONCO T on: 01/30/2024 02:36 PM   Modules accepted: Orders

## 2024-01-31 ENCOUNTER — Ambulatory Visit

## 2024-01-31 ENCOUNTER — Ambulatory Visit: Payer: Self-pay | Admitting: Family Medicine

## 2024-02-01 LAB — URINE CULTURE

## 2024-02-09 NOTE — Progress Notes (Unsigned)
   Subjective:   Patient Name: Erin Barker, female   DOB: 1985/12/20, 38 y.o.  MRN: [id number]  HPI Seen for follow up of SAB. Initial SAB with MAU visit on 11/22/2023. Still having spotting on 01/10/2024 so reevaluated in MAU at that time with hCG 20 and no IUP on US . At last visit on 9/15, was still seeing spotting with wiping and scant bleeding on pad. Repeated hCG which was <1. Patient wished to proceed with expectant management at that time with follow up today.  Review of Systems See HPI    Objective:   Physical Exam  Constitutional: She is oriented to person, place, and time. She appears well-developed and well-nourished.  Cardiovascular: Normal rate.  Abdominal: Soft. She exhibits no distension.  Neurological: She is alert and oriented to person, place, and time.  Skin: Skin is warm and dry.  Psychiatric: She has a normal mood and affect. Her behavior is normal. Judgment and thought content normal.      Assessment & Plan:  1. Vaginal spotting (Primary) Reviewed hCG negative for pregnancy on last check on 9/15.  2. Retained products of conception after miscarriage Negative hCG on 9/9 and 9/15   Patient counseled regarding future pregnancy plans and timing  Contraception: declined  Joesph DELENA Sear PA-C 02/09/24  6:41 PM

## 2024-02-10 ENCOUNTER — Encounter: Admitting: Family Medicine

## 2024-02-10 DIAGNOSIS — O034 Incomplete spontaneous abortion without complication: Secondary | ICD-10-CM

## 2024-02-10 DIAGNOSIS — N939 Abnormal uterine and vaginal bleeding, unspecified: Secondary | ICD-10-CM

## 2024-02-10 NOTE — Progress Notes (Signed)
 This encounter was created in error - please disregard.

## 2024-03-07 DIAGNOSIS — E66812 Obesity, class 2: Secondary | ICD-10-CM | POA: Diagnosis not present

## 2024-03-07 DIAGNOSIS — M545 Low back pain, unspecified: Secondary | ICD-10-CM | POA: Diagnosis not present

## 2024-03-13 DIAGNOSIS — M6289 Other specified disorders of muscle: Secondary | ICD-10-CM | POA: Diagnosis not present

## 2024-03-13 DIAGNOSIS — M545 Low back pain, unspecified: Secondary | ICD-10-CM | POA: Diagnosis not present

## 2024-03-30 DIAGNOSIS — J069 Acute upper respiratory infection, unspecified: Secondary | ICD-10-CM | POA: Diagnosis not present

## 2024-04-25 DIAGNOSIS — M47816 Spondylosis without myelopathy or radiculopathy, lumbar region: Secondary | ICD-10-CM | POA: Diagnosis not present

## 2024-06-19 ENCOUNTER — Emergency Department (HOSPITAL_BASED_OUTPATIENT_CLINIC_OR_DEPARTMENT_OTHER)
Admission: EM | Admit: 2024-06-19 | Discharge: 2024-06-19 | Disposition: A | Discharge status: Home / Self Care | Attending: Emergency Medicine | Admitting: Emergency Medicine

## 2024-06-19 ENCOUNTER — Other Ambulatory Visit (HOSPITAL_BASED_OUTPATIENT_CLINIC_OR_DEPARTMENT_OTHER): Payer: Self-pay

## 2024-06-19 ENCOUNTER — Other Ambulatory Visit: Payer: Self-pay

## 2024-06-19 ENCOUNTER — Encounter (HOSPITAL_BASED_OUTPATIENT_CLINIC_OR_DEPARTMENT_OTHER): Payer: Self-pay | Admitting: Emergency Medicine

## 2024-06-19 DIAGNOSIS — J029 Acute pharyngitis, unspecified: Secondary | ICD-10-CM | POA: Insufficient documentation

## 2024-06-19 LAB — GROUP A STREP BY PCR: Group A Strep by PCR: NOT DETECTED

## 2024-06-19 LAB — RESP PANEL BY RT-PCR (RSV, FLU A&B, COVID)  RVPGX2
Influenza A by PCR: NEGATIVE
Influenza B by PCR: NEGATIVE
Resp Syncytial Virus by PCR: NEGATIVE
SARS Coronavirus 2 by RT PCR: NEGATIVE

## 2024-06-19 LAB — PREGNANCY, URINE: Preg Test, Ur: NEGATIVE

## 2024-06-19 MED ORDER — ACETAMINOPHEN 500 MG PO TABS
1000.0000 mg | ORAL_TABLET | Freq: Once | ORAL | Status: AC
  Administered 2024-06-19: 1000 mg via ORAL
  Filled 2024-06-19: qty 2

## 2024-06-19 MED ORDER — LIDOCAINE VISCOUS HCL 2 % MT SOLN
15.0000 mL | Freq: Four times a day (QID) | OROMUCOSAL | 0 refills | Status: AC | PRN
  Filled 2024-06-19: qty 100, 2d supply, fill #0

## 2024-06-19 NOTE — Discharge Instructions (Signed)
 Use Tylenol  and ibuprofen to help with your pain.  You are also being prescribed lidocaine  which can help numb your throat.  If you develop fever, new or worsening pain, inability to swallow, neck pain or swelling, or any other new/concerning symptoms then return to the ER.

## 2024-06-19 NOTE — ED Triage Notes (Signed)
 Sore throat and body aches x 3 days , no cough .
